# Patient Record
Sex: Male | Born: 1945
Health system: Southern US, Community
[De-identification: ages and names within clinical notes are randomized; demographics above are authoritative.]

## PROBLEM LIST (undated history)

## (undated) ENCOUNTER — Emergency Department (HOSPITAL_COMMUNITY): Discharge status: Against Medical Advice | Payer: BC Managed Care – PPO

## (undated) DIAGNOSIS — G629 Polyneuropathy, unspecified: Secondary | ICD-10-CM

## (undated) DIAGNOSIS — I10 Essential (primary) hypertension: Secondary | ICD-10-CM

## (undated) DIAGNOSIS — E119 Type 2 diabetes mellitus without complications: Secondary | ICD-10-CM

## (undated) DIAGNOSIS — K5090 Crohn's disease, unspecified, without complications: Secondary | ICD-10-CM

## (undated) DIAGNOSIS — H35349 Macular cyst, hole, or pseudohole, unspecified eye: Secondary | ICD-10-CM

## (undated) HISTORY — PX: CATARACT EXTRACTION: SUR2

## (undated) HISTORY — DX: Macular cyst, hole, or pseudohole, unspecified eye: H35.349

## (undated) HISTORY — DX: Polyneuropathy, unspecified: G62.9

## (undated) HISTORY — DX: Type 2 diabetes mellitus without complications: E11.9

## (undated) HISTORY — PX: CERVICAL SPINE SURGERY: SHX589

## (undated) HISTORY — DX: Crohn's disease, unspecified, without complications: K50.90

## (undated) HISTORY — DX: Essential (primary) hypertension: I10

---

## 2004-05-13 ENCOUNTER — Ambulatory Visit (HOSPITAL_COMMUNITY): Admission: RE | Admit: 2004-05-13 | Discharge: 2004-05-13 | Payer: Self-pay | Admitting: *Deleted

## 2004-05-13 ENCOUNTER — Emergency Department (HOSPITAL_COMMUNITY): Admission: EM | Admit: 2004-05-13 | Discharge: 2004-05-13 | Payer: Self-pay | Admitting: *Deleted

## 2004-05-13 ENCOUNTER — Ambulatory Visit: Payer: Self-pay | Admitting: *Deleted

## 2004-05-14 ENCOUNTER — Ambulatory Visit: Payer: Self-pay | Admitting: *Deleted

## 2004-05-15 ENCOUNTER — Ambulatory Visit: Payer: Self-pay | Admitting: Cardiology

## 2004-05-15 ENCOUNTER — Ambulatory Visit (HOSPITAL_COMMUNITY): Admission: RE | Admit: 2004-05-15 | Discharge: 2004-05-15 | Payer: Self-pay | Admitting: *Deleted

## 2005-06-26 ENCOUNTER — Ambulatory Visit (HOSPITAL_COMMUNITY): Admission: RE | Admit: 2005-06-26 | Discharge: 2005-06-26 | Payer: Self-pay | Admitting: Internal Medicine

## 2005-06-30 ENCOUNTER — Ambulatory Visit (HOSPITAL_COMMUNITY): Admission: RE | Admit: 2005-06-30 | Discharge: 2005-06-30 | Payer: Self-pay | Admitting: Internal Medicine

## 2008-10-16 ENCOUNTER — Ambulatory Visit (HOSPITAL_COMMUNITY): Admission: RE | Admit: 2008-10-16 | Discharge: 2008-10-17 | Payer: Self-pay | Admitting: Neurosurgery

## 2010-04-17 LAB — GLUCOSE, CAPILLARY
Glucose-Capillary: 151 mg/dL — ABNORMAL HIGH (ref 70–99)
Glucose-Capillary: 238 mg/dL — ABNORMAL HIGH (ref 70–99)

## 2010-04-17 LAB — CBC
HCT: 41.8 % (ref 39.0–52.0)
Hemoglobin: 14.2 g/dL (ref 13.0–17.0)
RBC: 4.64 MIL/uL (ref 4.22–5.81)
RDW: 14.9 % (ref 11.5–15.5)

## 2010-04-17 LAB — BASIC METABOLIC PANEL
Calcium: 8.7 mg/dL (ref 8.4–10.5)
Chloride: 104 mEq/L (ref 96–112)
GFR calc non Af Amer: >60 mL/min (ref >60)
Sodium: 137 mEq/L (ref 135–145)

## 2011-02-24 ENCOUNTER — Other Ambulatory Visit: Payer: Self-pay

## 2011-09-07 DIAGNOSIS — R079 Chest pain, unspecified: Secondary | ICD-10-CM

## 2011-09-25 ENCOUNTER — Ambulatory Visit: Payer: Self-pay | Admitting: Cardiology

## 2012-06-24 ENCOUNTER — Other Ambulatory Visit (HOSPITAL_COMMUNITY): Payer: Self-pay | Admitting: Internal Medicine

## 2012-06-24 ENCOUNTER — Ambulatory Visit (HOSPITAL_COMMUNITY)
Admission: RE | Admit: 2012-06-24 | Discharge: 2012-06-24 | Disposition: A | Discharge status: Home / Self Care | Payer: BC Managed Care – PPO | Source: Ambulatory Visit | Attending: Internal Medicine | Admitting: Internal Medicine

## 2012-06-24 DIAGNOSIS — R7611 Nonspecific reaction to tuberculin skin test without active tuberculosis: Secondary | ICD-10-CM | POA: Insufficient documentation

## 2013-06-19 ENCOUNTER — Encounter: Payer: Self-pay | Admitting: Neurology

## 2013-06-19 ENCOUNTER — Encounter (INDEPENDENT_AMBULATORY_CARE_PROVIDER_SITE_OTHER): Payer: Self-pay

## 2013-06-19 ENCOUNTER — Ambulatory Visit (INDEPENDENT_AMBULATORY_CARE_PROVIDER_SITE_OTHER): Payer: BC Managed Care – PPO | Admitting: Neurology

## 2013-06-19 VITALS — BP 159/88 | HR 84 | Temp 97.2°F | Ht 69.0 in | Wt 169.0 lb

## 2013-06-19 DIAGNOSIS — R569 Unspecified convulsions: Secondary | ICD-10-CM

## 2013-06-19 DIAGNOSIS — IMO0001 Reserved for inherently not codable concepts without codable children: Secondary | ICD-10-CM

## 2013-06-19 DIAGNOSIS — K5090 Crohn's disease, unspecified, without complications: Secondary | ICD-10-CM

## 2013-06-19 DIAGNOSIS — R42 Dizziness and giddiness: Secondary | ICD-10-CM

## 2013-06-19 DIAGNOSIS — G609 Hereditary and idiopathic neuropathy, unspecified: Secondary | ICD-10-CM

## 2013-06-19 NOTE — Progress Notes (Addendum)
Subjective:    Patient ID: Erik Holder is a 68 y.o. male.  HPI    Huston Foley, MD, PhD [facility] [address], 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 The patient is self-referred and accompanied by his wife today. 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 This has been happening infrequently for the last year, more so in the last 4-5 months and more progressive. He has had many short-lived episodes. Never actually lost consciousness, no falls, no chest pain, no shortness of breath, no recurrent HAs. Never had TIA or stroke symptoms, denying sudden onset of one sided weakness, numbness, tingling, slurring of speech or droopy face, hearing loss, tinnitus, diplopia or visual field cut or monocular loss of vision, and denies recurrent headaches. He describes a vague sense of lightheadedness, feeling weak globally at times and a sense of zoning out for spacing out. These episodes tend to happen more in the earlier part of the day. He has had some episodes at work that scared him. He is aware of his environment and surrounding but sometimes cannot get the words out. Overall these episodes or spells are brief, lasting less than 5 minutes at a time and he has full recollection of these and no anterograde or retrograde amnesia. He has a history of cervical spine disease and had cervical spine surgery some 3 years ago. His GI doctor is at [facility].  His Past  Medical History Is Significant For: Past Medical History  Diagnosis Date  . Crohn's disease   . DM (diabetes mellitus)     His Past Surgical History Is Significant For: No past surgical history on file.  His Family History Is Significant For: Family History  Problem Relation Age of Onset  . Cancer Sister     His Social History Is Significant For: History   Social History  . Marital Status: Married    Spouse Name: N/A    Number of Children: N/A  . Years of Education: N/A   Social History Main Topics  . Smoking status: Never Smoker   . Smokeless tobacco: Not on file  . Alcohol Use: No  . Drug Use: No  . Sexual Activity: Not on file   Other Topics Concern  . Not on file   Social History Narrative  . No narrative on file    His Allergies Are:  No Known Allergies:   His Current Medications Are:  Outpatient Encounter Prescriptions as of 06/19/2013  Medication Sig  . azathioprine (IMURAN) 100 MG tablet Take 100 mg by mouth daily.  Marland Kitchen glipiZIDE (GLUCOTROL) 10 MG tablet Take 10 mg by mouth daily before breakfast.  . inFLIXimab (REMICADE) 100 MG injection Inject into the vein.  Marland Kitchen losartan (COZAAR) 100 MG tablet Take 100 mg by mouth daily.  . metFORMIN (GLUCOPHAGE) 500 MG tablet Take 500 mg by mouth daily with breakfast.   Review of Systems:  Out of a complete 14 point review of systems, all are reviewed and negative with  the exception of these symptoms as listed below:  Review of Systems  HENT: Negative.   Eyes: Negative.   Respiratory: Negative.   Cardiovascular: Negative.   Gastrointestinal: Positive for blood in stool.  Endocrine: Negative.   Genitourinary: Negative.   Musculoskeletal: Positive for myalgias.       Muscle cramps  Skin: Negative.   Allergic/Immunologic: Negative.   Neurological: Positive for dizziness, syncope and weakness.  Hematological: Negative.   Psychiatric/Behavioral: Negative.     Objective:  Neurologic Exam  Physical  Exam  Assessment:   Physical Examination:   Filed Vitals:   06/19/13 1500  BP: 159/88  Pulse: 84  Temp:     General Examination: The patient is a very pleasant 68 y.o. male in no acute distress. He appears well-developed and well-nourished and very well groomed. He is mildly anxious appearing.  HEENT: Normocephalic, atraumatic, pupils are equal, round and reactive to light and accommodation. Funduscopic exam is normal with sharp disc margins noted. Extraocular tracking is good without limitation to gaze excursion or nystagmus noted. Normal smooth pursuit is noted. Hearing is grossly intact. Tympanic membranes are clear bilaterally. Face is symmetric with normal facial animation and normal facial sensation. Speech is clear with no dysarthria noted. There is no hypophonia. There is no lip, neck/head, jaw or voice tremor. Neck is supple with full range of passive and active motion. There are no carotid bruits on auscultation. Oropharynx exam reveals: mild mouth dryness, good dental hygiene and mild airway crowding, due to redundant soft palate. His uvula seems to be trimmed are absent. His tonsils are small or absent. He has a significant gag reflex and resistance to the tongue blade. Mallampati is class III. Tongue protrudes centrally and palate elevates symmetrically.   Chest: Clear to auscultation without wheezing, rhonchi or crackles noted.  Heart: S1+S2+0, regular and normal without murmurs, rubs or gallops noted.   Abdomen: Soft, non-tender and non-distended with normal bowel sounds appreciated on auscultation.  Extremities: There is no pitting edema in the distal lower extremities bilaterally. Pedal pulses are intact.  Skin: Warm and dry without trophic changes noted. There are no varicose veins.  Musculoskeletal: exam reveals no obvious joint deformities, tenderness or joint swelling or erythema.   Neurologically:  Mental status: The patient is awake, alert and oriented in all 4  spheres. His immediate and remote memory, attention, language skills and fund of knowledge are appropriate. There is no evidence of aphasia, agnosia, apraxia or anomia. Speech is clear with normal prosody and enunciation. Thought process is linear. Mood is normal and affect is normal.  Cranial nerves II - XII are as described above under HEENT exam. In addition: shoulder shrug is normal with equal shoulder height noted. Motor exam: Normal bulk, strength and tone is noted. There is no drift, tremor or rebound. Romberg is negative. Reflexes are 2+ throughout with the exception of a 3+ right knee jerk. Babinski: Toes are flexor bilaterally. Fine motor skills and coordination: intact with normal finger taps, normal hand movements, normal rapid alternating patting, normal foot taps and normal foot agility. He does not have any myoclonus. Cerebellar testing: No dysmetria or intention tremor on finger to nose testing. Heel to shin is unremarkable bilaterally. There is no truncal or gait ataxia.  Sensory exam: intact to light touch, pinprick, vibration, temperature sense proprioception in the upper extremities and decreased to all modalities in the distal lower extremities up to above the ankles bilaterally.  Gait, station and balance: He stands easily. No  veering to one side is noted. No leaning to one side is noted. Posture is age-appropriate and stance is narrow based. Gait shows normal stride length and normal pace. No problems turning are noted. He turns en bloc, but overall walks somewhat cautiously. Tandem walk is mildly difficult for him and so his heel stance. He has intact toe stance.               Assessment and Plan:    In summary, Jamse MeadBelayenh S Nissen is a very pleasant 68 y.o.-year old male with a history of Crohn's disease which was diagnosed about 16 years ago and he was placed on Remicade at the time. He has also been on Imuran for the same amount of time. He has been diagnosed with diabetes around  the same time and was initially treated with steroids for his Crohn's disease. He has been well controlled as far as his Crohn's is concerned as well as his diabetes is concerned. He presents with a approximately one year history but more so with a few month history of intermittent confusion, staring-like spell, lightheadedness, a sense of falling and staggering without obvious physical limitations. He does not appear toxic or sick and has reassuringly nonfocal exam with the exception of a brisk right patellar reflex and evidence of peripheral neuropathy, most likely diabetic in etiology given his history. His symptomatology is rather vague. I would like to proceed with further testing in the form of the EEG, nerve conduction testing for his neuropathy, blood work, and MRI brain with and without contrast due to his long-standing history of immune suppressant therapy. I reassured him that his exam is largely benign. We will call him as the test results are reported. I did not suggest any new medication or change in management at this time. He was in agreement. Of note, he does have a history of cervical spine disease status post cervical spine surgery some 3 years ago.  I answered all their questions today and the patient and his wife were in agreement with the above outlined plan. I would like to see the patient back in 3 months, sooner if the need arises and encouraged them to call with any interim questions, concerns, problems or updates and test results.

## 2013-06-19 NOTE — Patient Instructions (Addendum)
Lets do more testing with MRI brain with and without contrast as well as EEG, blood work and EMG/NCV testing. We will call you with the test results as they come.

## 2013-06-20 ENCOUNTER — Encounter: Payer: Self-pay | Admitting: *Deleted

## 2013-06-20 NOTE — Progress Notes (Signed)
Quick Note:  Please call Dr. Kristian CoveyBefekadu: Pls advise him to talk to his PCP or GI doctor about B12 supplementation via injections. His B12 is too low at 204 and vitamin D is low as well. He may want to talk to his PCP regarding vit D prescription strength and in the meantime he can start vitamin D orally, 2000 units daily. All other labs including inflammatory markers and TSH, as well as ammonia level were normal. Huston FoleySaima Williams Dietrick, MD, PhD [facility] ([facility]  ______

## 2013-06-20 NOTE — Progress Notes (Signed)
Quick Note:  I relayed the results to pt, recommendations for Vit D and B 12 injections per pcp or GI. I will release to mychart. He will call back if needed. ______

## 2013-06-22 LAB — IFE AND PE, SERUM
ALBUMIN SERPL ELPH-MCNC: 4.2 g/dL (ref 3.2–5.6)
Albumin/Glob SerPl: 1.4 (ref 0.7–2.0)
Alpha 1: 0.2 g/dL (ref 0.1–0.4)
Alpha2 Glob SerPl Elph-Mcnc: 0.6 g/dL (ref 0.4–1.2)
B-GLOBULIN SERPL ELPH-MCNC: 0.9 g/dL (ref 0.6–1.3)
GAMMA GLOB SERPL ELPH-MCNC: 1.5 g/dL (ref 0.5–1.6)
Globulin, Total: 3.1 g/dL (ref 2.0–4.5)
IGG (IMMUNOGLOBIN G), SERUM: 1680 mg/dL — AB (ref 700–1600)
IgA/Immunoglobulin A, Serum: 209 mg/dL (ref 91–414)
IgM (Immunoglobulin M), Srm: 161 mg/dL (ref 40–230)
TOTAL PROTEIN: 7.3 g/dL (ref 6.0–8.5)

## 2013-06-22 LAB — VITAMIN D 25 HYDROXY (VIT D DEFICIENCY, FRACTURES): Vit D, 25-Hydroxy: 15.8 ng/mL — ABNORMAL LOW (ref 30.0–100.0)

## 2013-06-22 LAB — B12 AND FOLATE PANEL
FOLATE: 14.5 ng/mL (ref >3.0)
Vitamin B-12: 204 pg/mL — ABNORMAL LOW (ref 211–946)

## 2013-06-22 LAB — TSH: TSH: 2.05 u[IU]/mL (ref 0.450–4.500)

## 2013-06-22 LAB — RPR: SYPHILIS RPR SCR: NONREACTIVE

## 2013-06-22 LAB — SEDIMENTATION RATE: SED RATE: 9 mm/h (ref 0–30)

## 2013-06-22 LAB — C-REACTIVE PROTEIN: CRP: 0.2 mg/L (ref 0.0–4.9)

## 2013-06-22 LAB — AMMONIA: Ammonia: 77 ug/dL (ref 27–102)

## 2013-06-22 LAB — ANA W/REFLEX: ANA: NEGATIVE

## 2013-06-28 ENCOUNTER — Ambulatory Visit
Admission: RE | Admit: 2013-06-28 | Discharge: 2013-06-28 | Disposition: A | Discharge status: Home / Self Care | Payer: BC Managed Care – PPO | Source: Ambulatory Visit | Attending: Neurology | Admitting: Neurology

## 2013-06-28 DIAGNOSIS — K5090 Crohn's disease, unspecified, without complications: Secondary | ICD-10-CM

## 2013-06-28 DIAGNOSIS — IMO0001 Reserved for inherently not codable concepts without codable children: Secondary | ICD-10-CM

## 2013-06-28 DIAGNOSIS — R42 Dizziness and giddiness: Secondary | ICD-10-CM

## 2013-06-28 DIAGNOSIS — G609 Hereditary and idiopathic neuropathy, unspecified: Secondary | ICD-10-CM

## 2013-06-28 DIAGNOSIS — R404 Transient alteration of awareness: Secondary | ICD-10-CM

## 2013-06-28 MED ORDER — GADOBENATE DIMEGLUMINE 529 MG/ML IV SOLN
15.0000 mL | Freq: Once | INTRAVENOUS | Status: AC | PRN
  Administered 2013-06-28: 15 mL via INTRAVENOUS

## 2013-06-29 NOTE — Progress Notes (Signed)
Quick Note:  Please call patient regarding the recent brain MRI: The brain scan showed a normal structure of the brain and no significant volume loss or atrophy. There were changes in the deeper structures of the brain, which we call white matter changes or microvascular changes. These were reported as mild in His case. These are tiny white spots, that occur with time and are seen in a variety of conditions, including with normal aging, chronic hypertension, chronic headaches, especially migraine HAs, chronic diabetes, chronic hyperlipidemia. These are not strokes and no mass or lesion or contrast enhancement was seen which is reassuring. Again, there were no acute findings, such as a stroke, or mass or blood products. No further action is required on this test at this time, other than re-enforcing the importance of good blood pressure control, good cholesterol control, good blood sugar control, and weight management. Please remind patient to keep any upcoming appointments or tests and to call us with any interim questions, concerns, problems or updates. Thanks,  Huston Foley, MD, PhD    ______

## 2013-07-04 ENCOUNTER — Other Ambulatory Visit: Payer: BC Managed Care – PPO

## 2013-07-04 NOTE — Progress Notes (Signed)
Quick Note:  I called pt and spoke to his wife and relayed the results. He has NCS/EMG and the noted afterwards that he also has EEG too, on 07-07-13. She verbalized understanding. ______

## 2013-07-07 ENCOUNTER — Ambulatory Visit (INDEPENDENT_AMBULATORY_CARE_PROVIDER_SITE_OTHER): Payer: BC Managed Care – PPO | Admitting: Radiology

## 2013-07-07 ENCOUNTER — Encounter (INDEPENDENT_AMBULATORY_CARE_PROVIDER_SITE_OTHER): Payer: Self-pay

## 2013-07-07 ENCOUNTER — Ambulatory Visit (INDEPENDENT_AMBULATORY_CARE_PROVIDER_SITE_OTHER): Payer: BC Managed Care – PPO | Admitting: Neurology

## 2013-07-07 DIAGNOSIS — R404 Transient alteration of awareness: Secondary | ICD-10-CM

## 2013-07-07 DIAGNOSIS — IMO0001 Reserved for inherently not codable concepts without codable children: Secondary | ICD-10-CM

## 2013-07-07 DIAGNOSIS — R42 Dizziness and giddiness: Secondary | ICD-10-CM

## 2013-07-07 DIAGNOSIS — K5090 Crohn's disease, unspecified, without complications: Secondary | ICD-10-CM

## 2013-07-07 DIAGNOSIS — R209 Unspecified disturbances of skin sensation: Secondary | ICD-10-CM

## 2013-07-07 DIAGNOSIS — Z0289 Encounter for other administrative examinations: Secondary | ICD-10-CM

## 2013-07-07 DIAGNOSIS — R202 Paresthesia of skin: Secondary | ICD-10-CM

## 2013-07-07 DIAGNOSIS — G609 Hereditary and idiopathic neuropathy, unspecified: Secondary | ICD-10-CM

## 2013-07-07 NOTE — Procedures (Signed)
   NCS (NERVE CONDUCTION STUDY) WITH EMG (ELECTROMYOGRAPHY) REPORT   STUDY DATE: June 26th 2015 PATIENT NAME: Erik Holder DOB: 1945/04/29 MRN: [id number]    TECHNOLOGIST: Gearldine Shown ELECTROMYOGRAPHER: Levert Feinstein M.D.  CLINICAL INFORMATION:  68 years old male with history of Crohn's disease, DM, presenting with few years history of bilateral hands and feet paresthesia, few episodes of sudden confusion. Hx of cervical decompression surgery for cervical radiculopathy  On exam: He has mild to moderate lower extremity spasticity.  There was no upper and lower extremity weakness. Intact vibration, pin prick at bilateral upper and lower extremities. He was hyperreflexia at both upper and lower extremities.  Plantar responses were flexor bilaterally.  FINDINGS: NERVE CONDUCTION STUDY: Bilateral peroneal sensory responses were normal.  Bilateral peroneal and tibial motor responses were normal.  Bilateral tibial-H reflexes were normal.  NEEDLE ELECTROMYOGRAPHY: Selected needle exam was performed at right lower extremity muscles and right lumbosacral paraspinals.   Needle examination of right tibialis anterior, tibialis posterior, peroneal longus, medial gastrocnemius, vastus lateralis was normal.  There was no spontaneous activities at right lumbosacral paraspinals at right L4, L5, S1.  IMPRESSION:   This is a normal study.  There was no electrodiagnostic evidence of right lumbar radiculopathy, or large fiber peripheral neuropathy.   INTERPRETING PHYSICIAN:   Levert Feinstein M.D. Ph.D. [facility] [address] [place], [place] [address] [phone]

## 2013-07-10 NOTE — Progress Notes (Signed)
Quick Note:  Please call and advise the patient that the recent EMG and nerve conduction velocity test, which is the electrical nerve and muscle test we we performed, was reported as within normal limits. We checked for abnormal electrical discharges in the muscles or nerves and the report suggested normal findings. No further action is required on this test at this time. Please remind patient to keep any upcoming appointments or tests and to call us with any interim questions, concerns, problems or updates. Thanks,  Saima Athar, MD, PhD   ______ 

## 2013-07-11 DIAGNOSIS — R42 Dizziness and giddiness: Secondary | ICD-10-CM | POA: Insufficient documentation

## 2013-07-11 DIAGNOSIS — IMO0001 Reserved for inherently not codable concepts without codable children: Secondary | ICD-10-CM | POA: Insufficient documentation

## 2013-07-11 NOTE — Procedures (Signed)
   HISTORY: 68 years old male, with episodes of intermittent confusion, there like spells, lightheadedness, since a falling, EEG for further evaluations  TECHNIQUE:  16 channel EEG was performed based on standard 10-16 international system. One channel was dedicated to EKG, which has demonstrates normal sinus rhythm of 78 beats per minutes.  Upon awakening, the posterior background activity was well-developed, in alpha range, 9 Hz, with amplitude of 40 microvoltage, reactive to eye opening and closure.  There was no evidence of epileptiform discharge.  Photic stimulation was performed, which induced a symmetric photic driving.  Hyperventilation was performed, there was no abnormality elicit.  Stage II sleep was achieved, as evident by vortex waves, sleep spindles, K complexes  CONCLUSION: This is a  normal awake and asleep EEG.  There is no electrodiagnostic evidence of epileptiform discharge

## 2013-07-12 ENCOUNTER — Encounter: Payer: Self-pay | Admitting: *Deleted

## 2013-07-12 NOTE — Progress Notes (Signed)
Quick Note:  Mailed copy of report to home address. He is to call if questions. Attempted to call home, no answer. Was not at work number. ______

## 2013-07-12 NOTE — Progress Notes (Signed)
Quick Note:  Please call and advise the patient that the EEG or brain wave test we performed was reported as normal in the awake and light sleep states. We checked for abnormal electrical discharges in the brain waves and the report suggested normal findings. No further action is required on this test at this time. Please remind patient to keep any upcoming appointments or tests and to call us with any interim questions, concerns, problems or updates. Thanks,  Huston Foley, MD, PhD    ______

## 2013-07-12 NOTE — Progress Notes (Signed)
Quick Note:  Shared normal NCV/EMG results with patient's wife and she verbalized understanding ______

## 2013-11-20 ENCOUNTER — Ambulatory Visit: Payer: BC Managed Care – PPO | Admitting: Neurology

## 2013-11-30 ENCOUNTER — Ambulatory Visit: Payer: BC Managed Care – PPO | Admitting: Neurology

## 2013-11-30 ENCOUNTER — Telehealth: Payer: Self-pay | Admitting: Neurology

## 2013-11-30 NOTE — Telephone Encounter (Signed)
Patient is a no show for today's appointment  (11/30/13)

## 2016-12-30 ENCOUNTER — Other Ambulatory Visit (HOSPITAL_COMMUNITY): Payer: Self-pay | Admitting: Internal Medicine

## 2016-12-30 ENCOUNTER — Ambulatory Visit (HOSPITAL_COMMUNITY)
Admission: RE | Admit: 2016-12-30 | Discharge: 2016-12-30 | Disposition: A | Discharge status: Home / Self Care | Payer: Medicare Other | Source: Ambulatory Visit | Attending: Internal Medicine | Admitting: Internal Medicine

## 2016-12-30 DIAGNOSIS — R05 Cough: Secondary | ICD-10-CM | POA: Diagnosis not present

## 2016-12-30 DIAGNOSIS — R059 Cough, unspecified: Secondary | ICD-10-CM

## 2017-11-08 ENCOUNTER — Other Ambulatory Visit: Payer: Self-pay | Admitting: Neurological Surgery

## 2017-11-08 DIAGNOSIS — M5416 Radiculopathy, lumbar region: Secondary | ICD-10-CM

## 2017-11-09 ENCOUNTER — Other Ambulatory Visit: Payer: Self-pay | Admitting: Internal Medicine

## 2017-11-09 DIAGNOSIS — M549 Dorsalgia, unspecified: Secondary | ICD-10-CM

## 2017-11-12 ENCOUNTER — Ambulatory Visit
Admission: RE | Admit: 2017-11-12 | Discharge: 2017-11-12 | Disposition: A | Discharge status: Home / Self Care | Payer: BLUE CROSS/BLUE SHIELD | Source: Ambulatory Visit | Attending: Internal Medicine | Admitting: Internal Medicine

## 2017-11-12 DIAGNOSIS — M549 Dorsalgia, unspecified: Secondary | ICD-10-CM

## 2017-11-16 ENCOUNTER — Ambulatory Visit
Admission: RE | Admit: 2017-11-16 | Discharge: 2017-11-16 | Disposition: A | Discharge status: Home / Self Care | Payer: Medicare Other | Source: Ambulatory Visit | Attending: Neurological Surgery | Admitting: Neurological Surgery

## 2017-11-16 DIAGNOSIS — M5416 Radiculopathy, lumbar region: Secondary | ICD-10-CM

## 2017-11-16 MED ORDER — IOPAMIDOL (ISOVUE-M 200) INJECTION 41%
1.0000 mL | Freq: Once | INTRAMUSCULAR | Status: AC
  Administered 2017-11-16: 1 mL via EPIDURAL

## 2017-11-16 MED ORDER — METHYLPREDNISOLONE ACETATE 40 MG/ML INJ SUSP (RADIOLOG
120.0000 mg | Freq: Once | INTRAMUSCULAR | Status: AC
  Administered 2017-11-16: 120 mg via EPIDURAL

## 2017-11-16 NOTE — Discharge Instructions (Signed)

## 2017-11-21 ENCOUNTER — Other Ambulatory Visit: Payer: BLUE CROSS/BLUE SHIELD

## 2018-03-16 DIAGNOSIS — M5126 Other intervertebral disc displacement, lumbar region: Secondary | ICD-10-CM | POA: Diagnosis present

## 2020-06-09 DIAGNOSIS — K5090 Crohn's disease, unspecified, without complications: Secondary | ICD-10-CM | POA: Diagnosis present

## 2020-06-18 DIAGNOSIS — I6522 Occlusion and stenosis of left carotid artery: Secondary | ICD-10-CM | POA: Diagnosis present

## 2020-07-01 ENCOUNTER — Emergency Department (HOSPITAL_COMMUNITY)
Admission: EM | Admit: 2020-07-01 | Discharge: 2020-07-01 | Discharge status: Against Medical Advice | Payer: Medicare Other

## 2020-07-01 NOTE — ED Notes (Signed)
Pt left AMA °

## 2020-07-02 ENCOUNTER — Encounter (HOSPITAL_COMMUNITY): Payer: Self-pay | Admitting: *Deleted

## 2020-07-02 ENCOUNTER — Other Ambulatory Visit: Payer: Self-pay

## 2020-07-02 ENCOUNTER — Emergency Department (HOSPITAL_COMMUNITY)
Admission: EM | Admit: 2020-07-02 | Discharge: 2020-07-02 | Disposition: A | Discharge status: Home / Self Care | Payer: Medicare Other | Attending: Emergency Medicine | Admitting: Emergency Medicine

## 2020-07-02 ENCOUNTER — Emergency Department (HOSPITAL_COMMUNITY): Payer: Medicare Other

## 2020-07-02 DIAGNOSIS — Z87891 Personal history of nicotine dependence: Secondary | ICD-10-CM | POA: Diagnosis not present

## 2020-07-02 DIAGNOSIS — E119 Type 2 diabetes mellitus without complications: Secondary | ICD-10-CM | POA: Insufficient documentation

## 2020-07-02 DIAGNOSIS — I10 Essential (primary) hypertension: Secondary | ICD-10-CM | POA: Diagnosis not present

## 2020-07-02 DIAGNOSIS — M5416 Radiculopathy, lumbar region: Secondary | ICD-10-CM | POA: Diagnosis not present

## 2020-07-02 DIAGNOSIS — Z79899 Other long term (current) drug therapy: Secondary | ICD-10-CM | POA: Diagnosis not present

## 2020-07-02 DIAGNOSIS — M545 Low back pain, unspecified: Secondary | ICD-10-CM | POA: Diagnosis present

## 2020-07-02 MED ORDER — DIAZEPAM 5 MG PO TABS
5.0000 mg | ORAL_TABLET | Freq: Once | ORAL | Status: AC
  Administered 2020-07-02: 5 mg via ORAL
  Filled 2020-07-02: qty 1

## 2020-07-02 MED ORDER — ACETAMINOPHEN 500 MG PO TABS
1000.0000 mg | ORAL_TABLET | Freq: Once | ORAL | Status: AC
  Administered 2020-07-02: 1000 mg via ORAL
  Filled 2020-07-02: qty 2

## 2020-07-02 MED ORDER — DIAZEPAM 5 MG PO TABS: 5.0000 mg | ORAL_TABLET | Freq: Three times a day (TID) | ORAL | 0 refills | Status: AC | PRN

## 2020-07-02 MED ORDER — KETOROLAC TROMETHAMINE 30 MG/ML IJ SOLN
30.0000 mg | Freq: Once | INTRAMUSCULAR | Status: AC
  Administered 2020-07-02: 30 mg via INTRAMUSCULAR
  Filled 2020-07-02: qty 1

## 2020-07-02 NOTE — ED Provider Notes (Signed)
[facility] EMERGENCY DEPARTMENT Provider Note   CSN: [id number] Arrival date & time: 07/02/20  1517     History Chief Complaint  Patient presents with   Back Pain    Erik Holder is a 75 y.o. male.  Patient presents with low back pain radiating down to right leg.  He has known history of L5-S1 disc herniation which has been present for approximately 4 years.  He reports that he follows with a spinal surgeon in [place].  He has not had any procedures on his low back but has been doing physical therapy and exercises for the last few years with great response.  Approximately 1 month ago he noticed worsening pain in his low back which has not improved with exercise.  Approximately 2 days ago he was trying a technique using pillows to help adjust his low back and since then has been having considerably worse pain.  He reports that for the most part his pain used to only be on the lateral aspect of his right lower extremity down to his ankle and may be a little in his low back but it is now predominantly in his low back.  He reports weakness which she is unsure if it is due to pain or true weakness.  The only medication he has taken is Tylenol with his last dose being at 7 AM this morning.  He acknowledges that in the past he had gotten steroid shots in his lumbar spine with little to no improvement.  MRI on 11/12/2017 showed L5-S1 right subarticular disc protrusion effacing the right lateral recess and displacing the right S1 nerve root.Patient was hospitalized on 5/29 at [facility] and had a CT lumbar spine completed showing similar L5-S1 right central to subarticular disc herniation.  There is question whether the right S1 nerve root was mildly displaced posteriorly.  There was also note of nonspecific small sclerotic lesions in the left iliac crest and in the L1 vertebral body may represent bony islands but small sclerotic metastatic lesions are not excluded.      Past Medical History:   Diagnosis Date   Crohn's disease (HCC)    DM (diabetes mellitus) (HCC)    HTN (hypertension)    Macular cyst, hole, or pseudohole of retina    Neuropathy     Patient Active Problem List   Diagnosis Date Noted   Staring spell 07/11/2013   Dizziness and giddiness 07/11/2013   Paresthesia 07/07/2013    Past Surgical History:  Procedure Laterality Date   CATARACT EXTRACTION     CERVICAL SPINE SURGERY         Family History  Problem Relation Age of Onset   Cancer Sister     Social History   Tobacco Use   Smoking status: Former    Pack years: 0.00    Types: Cigarettes    Quit date: 08/20/1991    Years since quitting: 28.8  Substance Use Topics   Alcohol use: No   Drug use: No    Home Medications Prior to Admission medications   Medication Sig Start Date End Date Taking? Authorizing Provider  amLODipine (NORVASC) 5 MG tablet Take 1 tablet by mouth daily. 04/29/20  Yes [provider]  atorvastatin (LIPITOR) 40 MG tablet Take 1 tablet by mouth daily. 06/17/20  Yes [provider]  azathioprine (IMURAN) 100 MG tablet Take 100 mg by mouth daily.   Yes [provider]  diazepam (VALIUM) 5 MG tablet Take 1 tablet (5 mg  total) by mouth every 8 (eight) hours as needed for up to 3 days for anxiety. 07/02/20 07/05/20 Yes Derrel Nip, MD  losartan (COZAAR) 100 MG tablet Take 100 mg by mouth daily.   Yes [provider]  Mesalamine 800 MG TBEC Take 2 tablets by mouth 2 (two) times daily. 05/03/20  Yes [provider]  TRADJENTA 5 MG TABS tablet Take 5 mg by mouth daily. 06/18/20  Yes [provider]  baclofen (LIORESAL) 10 MG tablet Take 10 mg by mouth 2 (two) times daily. Patient not taking: Reported on 07/02/2020 06/12/20   [provider]    Allergies    Metformin  Review of Systems   Review of Systems  Constitutional:  Negative for chills and fever.  HENT:  Negative for congestion and rhinorrhea.   Respiratory:   Negative for shortness of breath.   Cardiovascular:  Negative for chest pain.  Gastrointestinal:  Negative for abdominal pain, blood in stool, diarrhea, nausea and vomiting.  Genitourinary:  Negative for hematuria.  Musculoskeletal:  Positive for back pain.  Neurological:  Positive for weakness (In the right lower extremity, may be limited by pain). Negative for headaches.   Physical Exam Updated Vital Signs BP (!) 166/81 (BP Location: Right Arm)   Pulse 71   Temp 97.7 F (36.5 C) (Oral)   Resp 18   Ht 5\' 9"  (1.753 m)   Wt 64.9 kg   SpO2 99%   BMI 21.12 kg/m   Physical Exam Constitutional:      Appearance: Normal appearance. He is normal weight.  HENT:     Head: Normocephalic and atraumatic.  Eyes:     Extraocular Movements: Extraocular movements intact.  Cardiovascular:     Rate and Rhythm: Normal rate and regular rhythm.     Heart sounds: No murmur heard. Pulmonary:     Effort: Pulmonary effort is normal. No respiratory distress.     Breath sounds: Normal breath sounds.  Abdominal:     General: Abdomen is flat. There is no distension.     Palpations: Abdomen is soft.  Musculoskeletal:        General: Tenderness (Tenderness to palpation in the paraspinal area right lumbar region.) present.     Cervical back: Normal range of motion.     Right lower leg: No edema.  Neurological:     Mental Status: He is alert.     Sensory: No sensory deficit.     Motor: Weakness (Diffuse weakness in the right lower extremity.  Patient is unsure if it is limited by pain versus true weakness) present.  Psychiatric:        Mood and Affect: Mood normal.    ED Results / Procedures / Treatments   Labs (all labs ordered are listed, but only abnormal results are displayed) Labs Reviewed - No data to display  EKG None  Radiology MR LUMBAR SPINE WO CONTRAST  Result Date: 07/02/2020 CLINICAL DATA:  Lumbar radiculopathy.  Right leg pain EXAM: MRI LUMBAR SPINE WITHOUT CONTRAST TECHNIQUE:  Multiplanar, multisequence MR imaging of the lumbar spine was performed. No intravenous contrast was administered. COMPARISON:  Lumbar MRI 11/12/2017 FINDINGS: Segmentation:  Normal Alignment:  Normal Vertebrae:  Normal bone marrow.  Negative for fracture or mass. Conus medullaris and cauda equina: Conus extends to the L2 level. Conus and cauda equina appear normal. Paraspinal and other soft tissues: Negative for paraspinous mass or adenopathy. Disc levels: L1-2: Negative L2-3: Mild disc bulging and mild facet degeneration. Negative for  stenosis L3-4: Mild disc and mild facet degeneration.  Negative for stenosis. L4-5: Mild disc bulging and mild facet degeneration. Mild subarticular stenosis bilaterally. L5-S1: Right-sided disc protrusion with compression of the right S1 nerve root, similar to the prior MRI. Disc bulging and mild left subarticular stenosis. IMPRESSION: Right-sided disc protrusion L5-S1 with right S1 nerve root impingement, similar to the prior MRI 2019. Mild degenerative change throughout the remainder of the lumbar spine. Electronically Signed   By: Marlan Palau M.D.   On: 07/02/2020 17:31    Procedures Procedures   Medications Ordered in ED Medications  ketorolac (TORADOL) 30 MG/ML injection 30 mg (30 mg Intramuscular Given 07/02/20 1644)  acetaminophen (TYLENOL) tablet 1,000 mg (1,000 mg Oral Given 07/02/20 1643)  diazepam (VALIUM) tablet 5 mg (5 mg Oral Given 07/02/20 1643)    ED Course  I have reviewed the triage vital signs and the nursing notes.  Pertinent labs & imaging results that were available during my care of the patient were reviewed by me and considered in my medical decision making (see chart for details).    MDM Rules/Calculators/A&P                          75 year old male with known L5-S1 lumbar disc herniation presenting with worsening low back pain and weakness in the right lower extremity.  On arrival vital signs are within normal limit.  Patient reports  he is unable to bear weight on the right lower extremity due to pain/weakness.  Most recent imaging was approximately 1 month ago.  It showed stable disc herniation but the progression has happened over the last 2 days.  Patient given one-time dose of Toradol as well as 1 g of Tylenol.  MRI of the lumbar spine ordered.  Patient was also given Valium to assist with the pain during MRI.  MRI findings consistent with similar findings from the MRI in 2019.  Patient has close follow-up with neurosurgeon so patient was discharged with 10 doses of Valium and he will follow-up with his neurosurgeon on Thursday 6/23.  Strict ED and return precautions given. Final Clinical Impression(s) / ED Diagnoses Final diagnoses:  Lumbar radiculopathy    Rx / DC Orders ED Discharge Orders          Ordered    diazepam (VALIUM) 5 MG tablet  Every 8 hours PRN        07/02/20 1844             Derrel Nip, MD 07/02/20 1844    Blane Ohara, MD 07/03/20 0000

## 2020-07-02 NOTE — ED Notes (Signed)
Pt in hallway 4, pt states that Sunday he did an exercise with a pillow and since then he has had decreased sensation and strength in his R leg.

## 2020-07-02 NOTE — Discharge Instructions (Addendum)
It was great meeting you today.  I am sorry you are having these problems with your radiculopathy.  We completed an MRI which was essentially stable from your previous MRI in 2019.  I sent a prescription for Valium to your pharmacy to tide you over until you see your neurosurgeon on Thursday.  I hope you get to feeling better!

## 2020-07-02 NOTE — ED Notes (Signed)
Pt moved to er room 8, pt reports 7/10 back pain

## 2020-07-02 NOTE — ED Notes (Signed)
Pt back from mri, pt reports a slight decrease in pain

## 2020-07-02 NOTE — ED Triage Notes (Signed)
Pt with lower back pain worse since yesterday morning after trying an exercise for his back.  Pt with hx of herniated disc to back.  Pt with tingling to right leg.

## 2020-07-06 ENCOUNTER — Emergency Department (HOSPITAL_COMMUNITY)
Admission: EM | Admit: 2020-07-06 | Discharge: 2020-07-06 | Disposition: A | Discharge status: Home / Self Care | Payer: Medicare Other | Attending: Emergency Medicine | Admitting: Emergency Medicine

## 2020-07-06 ENCOUNTER — Emergency Department (HOSPITAL_COMMUNITY): Payer: Medicare Other

## 2020-07-06 ENCOUNTER — Encounter (HOSPITAL_COMMUNITY): Payer: Self-pay | Admitting: *Deleted

## 2020-07-06 DIAGNOSIS — K6289 Other specified diseases of anus and rectum: Secondary | ICD-10-CM

## 2020-07-06 DIAGNOSIS — Z79899 Other long term (current) drug therapy: Secondary | ICD-10-CM | POA: Diagnosis not present

## 2020-07-06 DIAGNOSIS — Z7984 Long term (current) use of oral hypoglycemic drugs: Secondary | ICD-10-CM | POA: Diagnosis not present

## 2020-07-06 DIAGNOSIS — I10 Essential (primary) hypertension: Secondary | ICD-10-CM | POA: Insufficient documentation

## 2020-07-06 DIAGNOSIS — Z87891 Personal history of nicotine dependence: Secondary | ICD-10-CM | POA: Diagnosis not present

## 2020-07-06 DIAGNOSIS — E1140 Type 2 diabetes mellitus with diabetic neuropathy, unspecified: Secondary | ICD-10-CM | POA: Insufficient documentation

## 2020-07-06 DIAGNOSIS — K5900 Constipation, unspecified: Secondary | ICD-10-CM | POA: Diagnosis not present

## 2020-07-06 DIAGNOSIS — R109 Unspecified abdominal pain: Secondary | ICD-10-CM | POA: Diagnosis present

## 2020-07-06 LAB — CBC WITH DIFFERENTIAL/PLATELET
Abs Immature Granulocytes: 0.05 10*3/uL (ref 0.00–0.07)
Basophils Absolute: 0 10*3/uL (ref 0.0–0.1)
Basophils Relative: 0 %
Eosinophils Absolute: 0 10*3/uL (ref 0.0–0.5)
Eosinophils Relative: 0 %
HCT: 38.8 % — ABNORMAL LOW (ref 39.0–52.0)
Hemoglobin: 13.1 g/dL (ref 13.0–17.0)
Immature Granulocytes: 0 %
Lymphocytes Relative: 39 %
Lymphs Abs: 4.7 10*3/uL — ABNORMAL HIGH (ref 0.7–4.0)
MCH: 32.6 pg (ref 26.0–34.0)
MCHC: 33.8 g/dL (ref 30.0–36.0)
MCV: 96.5 fL (ref 80.0–100.0)
Monocytes Absolute: 1.1 10*3/uL — ABNORMAL HIGH (ref 0.1–1.0)
Monocytes Relative: 9 %
Neutro Abs: 6.2 10*3/uL (ref 1.7–7.7)
Neutrophils Relative %: 52 %
Platelets: 239 10*3/uL (ref 150–400)
RBC: 4.02 MIL/uL — ABNORMAL LOW (ref 4.22–5.81)
RDW: 14.7 % (ref 11.5–15.5)
WBC: 12 10*3/uL — ABNORMAL HIGH (ref 4.0–10.5)
nRBC: 0 % (ref 0.0–0.2)

## 2020-07-06 LAB — COMPREHENSIVE METABOLIC PANEL
ALT: 29 U/L (ref 0–44)
AST: 26 U/L (ref 15–41)
Albumin: 3.9 g/dL (ref 3.5–5.0)
Alkaline Phosphatase: 80 U/L (ref 38–126)
Anion gap: 9 (ref 5–15)
BUN: 18 mg/dL (ref 8–23)
CO2: 26 mmol/L (ref 22–32)
Calcium: 9.1 mg/dL (ref 8.9–10.3)
Chloride: 100 mmol/L (ref 98–111)
Creatinine, Ser: 1.06 mg/dL (ref 0.61–1.24)
GFR, Estimated: >60 mL/min (ref >60)
Glucose, Bld: 196 mg/dL — ABNORMAL HIGH (ref 70–99)
Potassium: 3.3 mmol/L — ABNORMAL LOW (ref 3.5–5.1)
Sodium: 135 mmol/L (ref 135–145)
Total Bilirubin: 0.8 mg/dL (ref 0.3–1.2)
Total Protein: 7.1 g/dL (ref 6.5–8.1)

## 2020-07-06 LAB — SEDIMENTATION RATE: Sed Rate: 5 mm/hr (ref 0–16)

## 2020-07-06 LAB — LIPASE, BLOOD: Lipase: 99 U/L — ABNORMAL HIGH (ref 11–51)

## 2020-07-06 LAB — C-REACTIVE PROTEIN: CRP: 0.7 mg/dL (ref <1.0)

## 2020-07-06 MED ORDER — FENTANYL CITRATE (PF) 100 MCG/2ML IJ SOLN
50.0000 ug | Freq: Once | INTRAMUSCULAR | Status: AC
  Administered 2020-07-06: 50 ug via INTRAVENOUS
  Filled 2020-07-06: qty 2

## 2020-07-06 MED ORDER — IOHEXOL 9 MG/ML PO SOLN
ORAL | Status: AC
  Filled 2020-07-06: qty 1000

## 2020-07-06 MED ORDER — FLEET ENEMA 7-19 GM/118ML RE ENEM
1.0000 | ENEMA | Freq: Once | RECTAL | Status: AC
  Administered 2020-07-06: 1 via RECTAL

## 2020-07-06 MED ORDER — IOHEXOL 300 MG/ML  SOLN
75.0000 mL | Freq: Once | INTRAMUSCULAR | Status: AC | PRN
  Administered 2020-07-06: 75 mL via INTRAVENOUS

## 2020-07-06 NOTE — ED Provider Notes (Signed)
  Physical Exam  BP (!) 144/84 (BP Location: Right Arm)   Pulse 78   Temp 98.4 F (36.9 C) (Oral)   Resp 12   SpO2 100%   Physical Exam  ED Course/Procedures   Clinical Course as of 07/06/20 2057  Sat Jul 06, 2020  1425 Patient did not tolerate enema well, only able to do about half and could not hold it in his rectum. He is on bedside commode trying un-successfully to pass stool. Will check labs and CT to ensure no signs of crohn's flare.  [CS]  1451 CBC with mild leukocytosis [CS]  1506 CMP is unremarkable. Lipase mildly elevated of unclear significance.  [CS]  1516 Care of the patient signed out to Dr. Rhunette Croft at the change of shift.  [CS]    Clinical Course User Index [CS] Pollyann Savoy, MD    Procedures  MDM   Spoke with patient's GI doctors at [facility] after the CT scan.  The CT scan revealed inflammation around the rectum.  Patient also has severe constipation.  Patient thinks that his symptoms are because of constipation and unlikely to be a Crohn's flare.  I spoke with the GI team and they recommended getting inflammatory markers.  Inflammatory markers were actually negative.  Given that, they do not think he needs to be on steroids and have requested aggressive bowel regimen with stool softeners and laxatives.  For now they want him to take MiraLAX and add senna if needed.  This plan has been communicated to the patient.      Derwood Kaplan, MD 07/06/20 [phone]

## 2020-07-06 NOTE — ED Notes (Signed)
This nurse in at bedside during EDP's rectal exam.

## 2020-07-06 NOTE — ED Provider Notes (Signed)
[facility] EMERGENCY DEPARTMENT Provider Note  CSN: [id number] Arrival date & time: 07/06/20 1330    History Chief Complaint  Patient presents with   Abdominal Pain    Erik Holder is a 75 y.o. male with history of crohn's disease has recently been having issues with lumbar radiculopathy, followed by Neurosurgery and scheduled for surgery in 4 days. He was also seen in the ED for back pain 4 days ago and had MRI showing a disc protrusion but no cauda equina syndrome. He reports he has been unable to have a BM since then. He states he feels rectal pressure and is concerned he may be impacted. He has tried miralax and suppositories without relief. Some abdominal discomfort but most of his pain is lumbar.    Past Medical History:  Diagnosis Date   Crohn's disease (HCC)    DM (diabetes mellitus) (HCC)    HTN (hypertension)    Macular cyst, hole, or pseudohole of retina    Neuropathy     Past Surgical History:  Procedure Laterality Date   CATARACT EXTRACTION     CERVICAL SPINE SURGERY      Family History  Problem Relation Age of Onset   Cancer Sister     Social History   Tobacco Use   Smoking status: Former    Pack years: 0.00    Types: Cigarettes    Quit date: 08/20/1991    Years since quitting: 28.8   Smokeless tobacco: Never  Substance Use Topics   Alcohol use: No   Drug use: No     Home Medications Prior to Admission medications   Medication Sig Start Date End Date Taking? Authorizing Provider  amLODipine (NORVASC) 5 MG tablet Take 1 tablet by mouth daily. 04/29/20   [provider]  atorvastatin (LIPITOR) 40 MG tablet Take 1 tablet by mouth daily. 06/17/20   [provider]  azathioprine (IMURAN) 100 MG tablet Take 100 mg by mouth daily.    [provider]  baclofen (LIORESAL) 10 MG tablet Take 10 mg by mouth 2 (two) times daily. Patient not taking: Reported on 07/02/2020 06/12/20   [provider]  losartan (COZAAR) 100  MG tablet Take 100 mg by mouth daily.    [provider]  Mesalamine 800 MG TBEC Take 2 tablets by mouth 2 (two) times daily. 05/03/20   [provider]  TRADJENTA 5 MG TABS tablet Take 5 mg by mouth daily. 06/18/20   [provider]     Allergies    Metformin   Review of Systems   Review of Systems A comprehensive review of systems was completed and negative except as noted in HPI.    Physical Exam BP (!) 153/72   Pulse (!) 102   Resp 20   SpO2 99%   Physical Exam Vitals and nursing note reviewed.  Constitutional:      Appearance: Normal appearance.  HENT:     Head: Normocephalic and atraumatic.     Nose: Nose normal.     Mouth/Throat:     Mouth: Mucous membranes are moist.  Eyes:     Extraocular Movements: Extraocular movements intact.     Conjunctiva/sclera: Conjunctivae normal.  Cardiovascular:     Rate and Rhythm: Normal rate.  Pulmonary:     Effort: Pulmonary effort is normal.     Breath sounds: Normal breath sounds.  Abdominal:     General: Abdomen is flat.     Palpations: Abdomen is soft.  Tenderness: There is no abdominal tenderness. There is no guarding. Negative signs include Murphy's sign and McBurney's sign.  Genitourinary:    Comments: Rectal: chaperone present. Externally normal, mild tenderness on digital rectal exam, there is some stool in rectum but not particularly large or hard.  Musculoskeletal:        General: No swelling. Normal range of motion.     Cervical back: Neck supple.  Skin:    General: Skin is warm and dry.  Neurological:     General: No focal deficit present.     Mental Status: He is alert.  Psychiatric:        Mood and Affect: Mood normal.     ED Results / Procedures / Treatments   Labs (all labs ordered are listed, but only abnormal results are displayed) Labs Reviewed - No data to display  EKG None  Radiology No results found.  Procedures Procedures  Medications Ordered in the  ED Medications - No data to display   MDM Rules/Calculators/A&P MDM Patient states his prior crohns issues have been lower in the colon. It is possible his rectal pain is crohn's related as opposed to constipation. He would like to try a fleet's enema first and if no relief may need to move to labs and CT imaging. Patient is amenable to this plan.   ED Course  I have reviewed the triage vital signs and the nursing notes.  Pertinent labs & imaging results that were available during my care of the patient were reviewed by me and considered in my medical decision making (see chart for details).  Clinical Course as of 07/07/20 2585  Sat Jul 06, 2020  1425 Patient did not tolerate enema well, only able to do about half and could not hold it in his rectum. He is on bedside commode trying un-successfully to pass stool. Will check labs and CT to ensure no signs of crohn's flare.  [CS]  1451 CBC with mild leukocytosis [CS]  1506 CMP is unremarkable. Lipase mildly elevated of unclear significance.  [CS]  1516 Care of the patient signed out to Dr. Rhunette Croft at the change of shift.  [CS]    Clinical Course User Index [CS] Pollyann Savoy, MD    Final Clinical Impression(s) / ED Diagnoses Final diagnoses:  None    Rx / DC Orders ED Discharge Orders     None        Pollyann Savoy, MD 07/07/20 0700

## 2020-07-06 NOTE — ED Notes (Signed)
ED Provider at bedside. 

## 2020-07-06 NOTE — ED Notes (Signed)
Pt states that he had a small BM and feels better.

## 2020-07-06 NOTE — ED Triage Notes (Signed)
Abdominal pain with constipation x 4 days 

## 2020-07-06 NOTE — Discharge Instructions (Addendum)
We saw in the ER for pain.  CT scan confirms inflammation around the rectum and severe constipation.  Your case was discussed with your GI team.  Because your inflammatory markers are normal, they do not want to go on steroids.  They want you to focus on appropriate constipation management, by taking MiraLAX, you can add senna if not responding in 2 days.  Return to the ER if you start having worsening pain, fevers, chills, bloody stools.

## 2021-05-08 ENCOUNTER — Encounter: Payer: Self-pay | Admitting: "Endocrinology

## 2021-05-08 ENCOUNTER — Ambulatory Visit (INDEPENDENT_AMBULATORY_CARE_PROVIDER_SITE_OTHER): Payer: Medicare Other | Admitting: "Endocrinology

## 2021-05-08 VITALS — BP 136/68 | HR 84 | Wt 148.8 lb

## 2021-05-08 DIAGNOSIS — I10 Essential (primary) hypertension: Secondary | ICD-10-CM | POA: Insufficient documentation

## 2021-05-08 DIAGNOSIS — E559 Vitamin D deficiency, unspecified: Secondary | ICD-10-CM | POA: Diagnosis not present

## 2021-05-08 DIAGNOSIS — E782 Mixed hyperlipidemia: Secondary | ICD-10-CM

## 2021-05-08 DIAGNOSIS — E119 Type 2 diabetes mellitus without complications: Secondary | ICD-10-CM | POA: Diagnosis not present

## 2021-05-08 MED ORDER — FREESTYLE LIBRE 2 SENSOR MISC: 1.0000 | 3 refills | Status: AC

## 2021-05-08 MED ORDER — FREESTYLE LIBRE 2 READER DEVI: 0 refills | Status: AC

## 2021-05-08 NOTE — Patient Instructions (Signed)

## 2021-05-08 NOTE — Progress Notes (Signed)
? ?                                                             Endocrinology Consult Note  ?     05/08/2021, 5:49 PM ? ? ?Subjective:  ? ? Patient ID: Erik Holder, male    DOB: 14-Nov-1945.  ?Erik Holder is being seen in consultation for management of currently controlled asymptomatic diabetes requested by  Erik Spar, MD. ? ? ?Past Medical History:  ?Diagnosis Date  ? Crohn's disease (HCC)   ? DM (diabetes mellitus) (HCC)   ? HTN (hypertension)   ? Macular cyst, hole, or pseudohole of retina   ? Neuropathy   ? ? ?Past Surgical History:  ?Procedure Laterality Date  ? CATARACT EXTRACTION    ? CERVICAL SPINE SURGERY    ? ? ?Social History  ? ?Socioeconomic History  ? Marital status: Married  ?  Spouse name: Not on file  ? Number of children: Not on file  ? Years of education: Not on file  ? Highest education level: Not on file  ?Occupational History  ? Not on file  ?Tobacco Use  ? Smoking status: Former  ?  Types: Cigarettes  ?  Quit date: 08/20/1991  ?  Years since quitting: 29.7  ? Smokeless tobacco: Never  ?Substance and Sexual Activity  ? Alcohol use: No  ? Drug use: No  ? Sexual activity: Not on file  ?Other Topics Concern  ? Not on file  ?Social History Narrative  ? Not on file  ? ?Social Determinants of Health  ? ?Financial Resource Strain: Not on file  ?Food Insecurity: Not on file  ?Transportation Needs: Not on file  ?Physical Activity: Not on file  ?Stress: Not on file  ?Social Connections: Not on file  ? ? ?Family History  ?Problem Relation Age of Onset  ? Cancer Sister   ? ? ?Outpatient Encounter Medications as of 05/08/2021  ?Medication Sig  ? Continuous Blood Gluc Receiver (FREESTYLE LIBRE 2 READER) DEVI As directed  ? Continuous Blood Gluc Sensor (FREESTYLE LIBRE 2 SENSOR) MISC 1 Piece by Does not apply route every 14 (fourteen) days.  ? atorvastatin (LIPITOR) 40 MG tablet Take 1 tablet by mouth every other day.  ? losartan (COZAAR) 100 MG tablet Take 100 mg  by mouth daily.  ? Mesalamine 800 MG TBEC Take 2 tablets by mouth 2 (two) times daily.  ? [DISCONTINUED] amLODipine (NORVASC) 5 MG tablet Take 1 tablet by mouth daily.  ? [DISCONTINUED] azathioprine (IMURAN) 100 MG tablet Take 100 mg by mouth daily.  ? [DISCONTINUED] baclofen (LIORESAL) 10 MG tablet Take 10 mg by mouth 2 (two) times daily. (Patient not taking: Reported on 07/02/2020)  ? [DISCONTINUED] TRADJENTA 5 MG TABS tablet Take 5 mg by mouth daily. (Patient not taking: Reported on 05/08/2021)  ? ?No facility-administered encounter medications on file as of 05/08/2021.  ? ? ?ALLERGIES: ?Allergies  ?Allergen Reactions  ? Metformin Diarrhea  ? ? ?VACCINATION STATUS: ? ?There is no immunization history on file for this patient. ? ?Diabetes ?He presents for his initial diabetic visit. He has type 2 diabetes mellitus. Onset time: He was diagnosed at approximate age of 52 years. His disease course has been improving. There are no hypoglycemic associated symptoms.  Pertinent negatives for hypoglycemia include no confusion, headaches, pallor or seizures. There are no diabetic associated symptoms. Pertinent negatives for diabetes include no chest pain, no fatigue, no polydipsia, no polyphagia, no polyuria and no weakness. There are no hypoglycemic complications. Symptoms are stable. There are no diabetic complications. Risk factors for coronary artery disease include dyslipidemia, diabetes mellitus, hypertension and male sex. Current diabetic treatment includes oral agent (monotherapy) (He was on Tradjenta 5 mg p.o. daily until 10 days ago when he stopped it due to tightening glycemic profile.). His weight is stable. He is following a generally healthy diet. Meal planning includes avoidance of concentrated sweets. He has not had a previous visit with a dietitian. He participates in exercise daily. His home blood glucose trend is decreasing steadily. His breakfast blood glucose range is generally 130-140 mg/dl. His lunch  blood glucose range is generally 130-140 mg/dl. His dinner blood glucose range is generally 130-140 mg/dl. His bedtime blood glucose range is generally 130-140 mg/dl. His overall blood glucose range is 130-140 mg/dl. (He wears a CGM device-Freestyle libre.  His AGP analysis shows average blood glucose of 130 for the last 14 days.  He has 92% time range, 8% slightly above range.  He has no hypoglycemia.  His recent A1c was 6+ percent.) An ACE inhibitor/angiotensin II receptor blocker is being taken.  ?Hyperlipidemia ?This is a chronic problem. Exacerbating diseases include diabetes. Pertinent negatives include no chest pain, myalgias or shortness of breath. Current antihyperlipidemic treatment includes statins. Risk factors for coronary artery disease include dyslipidemia, male sex and hypertension.  ?Hypertension ?This is a chronic problem. The problem is controlled. Pertinent negatives include no chest pain, headaches, neck pain, palpitations or shortness of breath. Risk factors for coronary artery disease include dyslipidemia and diabetes mellitus. Past treatments include lifestyle changes, angiotensin blockers and calcium channel blockers.  ? ? ?Review of Systems  ?Constitutional:  Negative for chills, fatigue, fever and unexpected weight change.  ?HENT:  Negative for dental problem, mouth sores and trouble swallowing.   ?Eyes:  Negative for visual disturbance.  ?Respiratory:  Negative for cough, choking, chest tightness, shortness of breath and wheezing.   ?Cardiovascular:  Negative for chest pain, palpitations and leg swelling.  ?Gastrointestinal:  Negative for abdominal distention, abdominal pain, constipation, diarrhea, nausea and vomiting.  ?Endocrine: Negative for polydipsia, polyphagia and polyuria.  ?Genitourinary:  Negative for dysuria, flank pain, hematuria and urgency.  ?Musculoskeletal:  Negative for back pain, gait problem, myalgias and neck pain.  ?Skin:  Negative for pallor, rash and wound.   ?Neurological:  Negative for seizures, syncope, weakness, numbness and headaches.  ?Psychiatric/Behavioral:  Negative for confusion and dysphoric mood.   ? ?Objective:  ?  ? ?  05/08/2021  ?  2:30 PM 07/06/2020  ?  8:41 PM 07/06/2020  ?  5:22 PM  ?Vitals with BMI  ?Weight 148 lbs 13 oz    ?Systolic 136 144 749  ?Diastolic 68 84 78  ?Pulse 84 78 80  ? ? ?BP 136/68   Pulse 84   Wt 148 lb 12.8 oz (67.5 kg)   BMI 21.97 kg/m?   ?Wt Readings from Last 3 Encounters:  ?05/08/21 148 lb 12.8 oz (67.5 kg)  ?07/02/20 143 lb (64.9 kg)  ?06/19/13 169 lb (76.7 kg)  ?  ? ? ?Limited physical exam ?Physical Exam ?Constitutional:   ?   General: He is not in acute distress. ?   Appearance: He is well-developed.  ?HENT:  ?   Head: Normocephalic and  atraumatic.  ?Neck:  ?   Thyroid: No thyromegaly.  ?Cardiovascular:  ?   Rate and Rhythm: Normal rate.  ?Pulmonary:  ?   Effort: Pulmonary effort is normal. No respiratory distress.  ?   Breath sounds: No wheezing.  ?Abdominal:  ?   Tenderness: There is no abdominal tenderness.  ?Musculoskeletal:  ?   Cervical back: Normal range of motion and neck supple.  ?Skin: ?   General: Skin is warm.  ?   Findings: No rash.  ?   Nails: There is no clubbing.  ?Neurological:  ?   Mental Status: He is alert and oriented to person, place, and time.  ?   Cranial Nerves: No cranial nerve deficit.  ?   Sensory: No sensory deficit.  ?   Gait: Gait normal.  ?   Deep Tendon Reflexes: Reflexes are normal and symmetric.  ?Psychiatric:     ?   Speech: Speech normal.     ?   Behavior: Behavior normal. Behavior is cooperative.     ?   Thought Content: Thought content normal.     ?   Judgment: Judgment normal.  ? ? ? ? ?CMP ( most recent) ?CMP  ?   ?Component Value Date/Time  ? NA 135 07/06/2020 1425  ? K 3.3 (L) 07/06/2020 1425  ? CL 100 07/06/2020 1425  ? CO2 26 07/06/2020 1425  ? GLUCOSE 196 (H) 07/06/2020 1425  ? BUN 18 07/06/2020 1425  ? CREATININE 1.06 07/06/2020 1425  ? CALCIUM 9.1 07/06/2020 1425  ? PROT 7.1  07/06/2020 1425  ? PROT 7.3 06/19/2013 1600  ? ALBUMIN 3.9 07/06/2020 1425  ? AST 26 07/06/2020 1425  ? ALT 29 07/06/2020 1425  ? ALKPHOS 80 07/06/2020 1425  ? BILITOT 0.8 07/06/2020 1425  ? GFRNONAA >60 07/06/2020 1

## 2021-06-06 ENCOUNTER — Emergency Department (HOSPITAL_COMMUNITY)
Admission: EM | Admit: 2021-06-06 | Discharge: 2021-06-06 | Discharge status: Against Medical Advice | Payer: Medicare Other | Attending: Emergency Medicine | Admitting: Emergency Medicine

## 2021-06-06 ENCOUNTER — Other Ambulatory Visit: Payer: Self-pay

## 2021-06-06 ENCOUNTER — Other Ambulatory Visit (HOSPITAL_COMMUNITY): Payer: Self-pay | Admitting: Gerontology

## 2021-06-06 ENCOUNTER — Encounter (HOSPITAL_COMMUNITY): Payer: Self-pay

## 2021-06-06 ENCOUNTER — Other Ambulatory Visit: Payer: Self-pay | Admitting: "Endocrinology

## 2021-06-06 ENCOUNTER — Other Ambulatory Visit: Payer: Self-pay | Admitting: Gerontology

## 2021-06-06 DIAGNOSIS — R531 Weakness: Secondary | ICD-10-CM | POA: Diagnosis present

## 2021-06-06 DIAGNOSIS — M6281 Muscle weakness (generalized): Secondary | ICD-10-CM

## 2021-06-06 DIAGNOSIS — M5416 Radiculopathy, lumbar region: Secondary | ICD-10-CM

## 2021-06-06 DIAGNOSIS — Z5321 Procedure and treatment not carried out due to patient leaving prior to being seen by health care provider: Secondary | ICD-10-CM | POA: Diagnosis not present

## 2021-06-06 DIAGNOSIS — M79604 Pain in right leg: Secondary | ICD-10-CM

## 2021-06-06 NOTE — ED Notes (Signed)
No longer present in lobby.  

## 2021-06-06 NOTE — ED Triage Notes (Signed)
Pt c/o bilateral leg weakness and calf pain while walking "for a while" but increasing the last 2 days.  Pt reports significant weakness in R leg this morning.  Pain score 3/10.   Pt reports having back surgery last Fall.  Pt able to ambulate to triage room w/o assistance.

## 2021-06-07 ENCOUNTER — Emergency Department (HOSPITAL_COMMUNITY): Payer: Medicare Other

## 2021-06-07 ENCOUNTER — Observation Stay (HOSPITAL_COMMUNITY)
Admission: EM | Admit: 2021-06-07 | Discharge: 2021-06-08 | Disposition: A | Discharge status: Home / Self Care | Payer: Medicare Other | Attending: Family Medicine | Admitting: Family Medicine

## 2021-06-07 ENCOUNTER — Other Ambulatory Visit: Payer: Self-pay

## 2021-06-07 ENCOUNTER — Encounter (HOSPITAL_COMMUNITY): Payer: Self-pay | Admitting: Emergency Medicine

## 2021-06-07 DIAGNOSIS — I10 Essential (primary) hypertension: Secondary | ICD-10-CM | POA: Diagnosis not present

## 2021-06-07 DIAGNOSIS — R29898 Other symptoms and signs involving the musculoskeletal system: Secondary | ICD-10-CM

## 2021-06-07 DIAGNOSIS — K5090 Crohn's disease, unspecified, without complications: Secondary | ICD-10-CM | POA: Diagnosis not present

## 2021-06-07 DIAGNOSIS — E119 Type 2 diabetes mellitus without complications: Secondary | ICD-10-CM

## 2021-06-07 DIAGNOSIS — Z79899 Other long term (current) drug therapy: Secondary | ICD-10-CM | POA: Insufficient documentation

## 2021-06-07 DIAGNOSIS — Z87891 Personal history of nicotine dependence: Secondary | ICD-10-CM | POA: Diagnosis not present

## 2021-06-07 DIAGNOSIS — R531 Weakness: Secondary | ICD-10-CM | POA: Diagnosis present

## 2021-06-07 DIAGNOSIS — I639 Cerebral infarction, unspecified: Principal | ICD-10-CM

## 2021-06-07 DIAGNOSIS — E1169 Type 2 diabetes mellitus with other specified complication: Secondary | ICD-10-CM | POA: Insufficient documentation

## 2021-06-07 DIAGNOSIS — I63232 Cerebral infarction due to unspecified occlusion or stenosis of left carotid arteries: Secondary | ICD-10-CM | POA: Diagnosis not present

## 2021-06-07 DIAGNOSIS — I6522 Occlusion and stenosis of left carotid artery: Secondary | ICD-10-CM | POA: Diagnosis not present

## 2021-06-07 DIAGNOSIS — Z8673 Personal history of transient ischemic attack (TIA), and cerebral infarction without residual deficits: Secondary | ICD-10-CM | POA: Insufficient documentation

## 2021-06-07 DIAGNOSIS — I679 Cerebrovascular disease, unspecified: Secondary | ICD-10-CM

## 2021-06-07 DIAGNOSIS — R29701 NIHSS score 1: Secondary | ICD-10-CM

## 2021-06-07 DIAGNOSIS — E782 Mixed hyperlipidemia: Secondary | ICD-10-CM | POA: Diagnosis not present

## 2021-06-07 DIAGNOSIS — M5126 Other intervertebral disc displacement, lumbar region: Secondary | ICD-10-CM | POA: Insufficient documentation

## 2021-06-07 DIAGNOSIS — I63412 Cerebral infarction due to embolism of left middle cerebral artery: Principal | ICD-10-CM | POA: Insufficient documentation

## 2021-06-07 LAB — COMPREHENSIVE METABOLIC PANEL
ALT: 25 U/L (ref 0–44)
AST: 29 U/L (ref 15–41)
Albumin: 4.1 g/dL (ref 3.5–5.0)
Alkaline Phosphatase: 69 U/L (ref 38–126)
Anion gap: 8 (ref 5–15)
BUN: 14 mg/dL (ref 8–23)
CO2: 26 mmol/L (ref 22–32)
Calcium: 9.5 mg/dL (ref 8.9–10.3)
Chloride: 101 mmol/L (ref 98–111)
Creatinine, Ser: 1.1 mg/dL (ref 0.61–1.24)
GFR, Estimated: >60 mL/min (ref >60)
Glucose, Bld: 173 mg/dL — ABNORMAL HIGH (ref 70–99)
Potassium: 3.5 mmol/L (ref 3.5–5.1)
Sodium: 135 mmol/L (ref 135–145)
Total Bilirubin: 0.9 mg/dL (ref 0.3–1.2)
Total Protein: 7.7 g/dL (ref 6.5–8.1)

## 2021-06-07 LAB — DIFFERENTIAL
Abs Immature Granulocytes: 0.01 10*3/uL (ref 0.00–0.07)
Basophils Absolute: 0 10*3/uL (ref 0.0–0.1)
Basophils Relative: 0 %
Eosinophils Absolute: 0 10*3/uL (ref 0.0–0.5)
Eosinophils Relative: 0 %
Immature Granulocytes: 0 %
Lymphocytes Relative: 46 %
Lymphs Abs: 3 10*3/uL (ref 0.7–4.0)
Monocytes Absolute: 0.5 10*3/uL (ref 0.1–1.0)
Monocytes Relative: 7 %
Neutro Abs: 3.1 10*3/uL (ref 1.7–7.7)
Neutrophils Relative %: 47 %

## 2021-06-07 LAB — VITAMIN B12: Vitamin B-12: 794 pg/mL (ref 180–914)

## 2021-06-07 LAB — CBC
HCT: 43.7 % (ref 39.0–52.0)
Hemoglobin: 14.2 g/dL (ref 13.0–17.0)
MCH: 29.3 pg (ref 26.0–34.0)
MCHC: 32.5 g/dL (ref 30.0–36.0)
MCV: 90.1 fL (ref 80.0–100.0)
Platelets: 274 10*3/uL (ref 150–400)
RBC: 4.85 MIL/uL (ref 4.22–5.81)
RDW: 13.9 % (ref 11.5–15.5)
WBC: 6.7 10*3/uL (ref 4.0–10.5)
nRBC: 0 % (ref 0.0–0.2)

## 2021-06-07 LAB — PROTIME-INR
INR: 1.2 (ref 0.8–1.2)
Prothrombin Time: 15.4 seconds — ABNORMAL HIGH (ref 11.4–15.2)

## 2021-06-07 LAB — HEMOGLOBIN A1C
Hgb A1c MFr Bld: 6.2 % — ABNORMAL HIGH (ref 4.8–5.6)
Mean Plasma Glucose: 131.24 mg/dL

## 2021-06-07 LAB — MAGNESIUM: Magnesium: 2.1 mg/dL (ref 1.7–2.4)

## 2021-06-07 LAB — APTT: aPTT: 37 seconds — ABNORMAL HIGH (ref 24–36)

## 2021-06-07 LAB — CK: Total CK: 146 U/L (ref 49–397)

## 2021-06-07 LAB — TROPONIN I (HIGH SENSITIVITY)
Troponin I (High Sensitivity): 5 ng/L (ref <18)
Troponin I (High Sensitivity): 4 ng/L (ref <18)

## 2021-06-07 LAB — TSH: TSH: 1.38 u[IU]/mL (ref 0.350–4.500)

## 2021-06-07 MED ORDER — SODIUM CHLORIDE 0.9% FLUSH
3.0000 mL | Freq: Once | INTRAVENOUS | Status: AC
  Administered 2021-06-07: 3 mL via INTRAVENOUS

## 2021-06-07 MED ORDER — HYDRALAZINE HCL 20 MG/ML IJ SOLN: 10.0000 mg | Freq: Three times a day (TID) | INTRAMUSCULAR | Status: DC | PRN

## 2021-06-07 MED ORDER — ACETAMINOPHEN 325 MG PO TABS: 650.0000 mg | ORAL_TABLET | ORAL | Status: DC | PRN

## 2021-06-07 MED ORDER — STROKE: EARLY STAGES OF RECOVERY BOOK
Freq: Once | Status: AC
  Filled 2021-06-07: qty 1

## 2021-06-07 MED ORDER — ACETAMINOPHEN 160 MG/5ML PO SOLN: 650.0000 mg | ORAL | Status: DC | PRN

## 2021-06-07 MED ORDER — IOHEXOL 350 MG/ML SOLN
75.0000 mL | Freq: Once | INTRAVENOUS | Status: AC | PRN
  Administered 2021-06-07: 75 mL via INTRAVENOUS

## 2021-06-07 MED ORDER — ATORVASTATIN CALCIUM 40 MG PO TABS
40.0000 mg | ORAL_TABLET | ORAL | Status: DC
Start: 2021-06-08 — End: 2021-06-08

## 2021-06-07 MED ORDER — MESALAMINE 400 MG PO CPDR
1600.0000 mg | DELAYED_RELEASE_CAPSULE | Freq: Two times a day (BID) | ORAL | Status: DC
  Administered 2021-06-07 – 2021-06-08 (×2): 1600 mg via ORAL
  Filled 2021-06-07 (×3): qty 4

## 2021-06-07 MED ORDER — ACETAMINOPHEN 650 MG RE SUPP: 650.0000 mg | RECTAL | Status: DC | PRN

## 2021-06-07 MED ORDER — SENNOSIDES-DOCUSATE SODIUM 8.6-50 MG PO TABS: 1.0000 | ORAL_TABLET | Freq: Every evening | ORAL | Status: DC | PRN

## 2021-06-07 NOTE — ED Notes (Signed)
Patient transported to CT 

## 2021-06-07 NOTE — Assessment & Plan Note (Addendum)
Followed by neurosurgery in past No red flags on exam and symptoms improving, likely related to stroke CT lumbar ordered in ED and pending>no acute fractures. DDD.  Mild L2-L3, L3-L4 and L4-L5, central canal stenosis Multilevel neuroforaminal stenosis including moderate right and mild left L2-3, mild bilateral L3-4, and moderate left L5-S1 neuroforaminal stenosis.

## 2021-06-07 NOTE — H&P (Addendum)
History and Physical    Patient: Erik Holder KZS:[id number] DOB: 05/14/1945 DOA: 06/07/2021 DOS: the patient was seen and examined on 06/07/2021 PCP: Benetta Spar, MD  Patient coming from: Home - lives with wife and son. Independent with all ADLs.    Chief Complaint: right leg weakness and unsteady gait x 1 day   HPI: Erik Holder is a 76 y.o. male with medical history significant of T2DM, HTN, HLD, chron's disease on remicade q 2 months, hx of CVA who presented to ED with complaints of bilateral leg weakness x 7 days ago. Initially he thought it was due to his increase in lipitor. They then advised that he take this every other day. Two days ago he states his leg both felt odd and he thought he was going to fall. He changed shoes thinking that would help.  Yesterday he had right leg weakness that got progressively worse and was to the extent of dragging the ground and needing a cane. He also had some right arm weakness that felt heavy, but this seems to be doing better. He denies any focal deficits, speech deficits.    LKW: about one week ago    He has been feeling good. Denies any fever/chills, vision changes/headaches, chest pain or palpitations, shortness of breath or cough, abdominal pain, N/V/D, dysuria or leg swelling. He is quite fit. He walks 3-4 miles/day.    He does not smoke or drink alcohol. NO family history of CVA that he is aware of.    ER Course:  vitals: afebrile, bp: 181/88, HR; 107, RR: 18, oxygen: 100%RA Pertinent labs: glucose: 173,  CXR: no active disease CT head: probable old, small lacunar infarct in subcortical white matter of each frontal lobe, subacute infarct at either location not excluded.  MRI brain: acute perforator infarct at the left corona radiata.  In ED: neurology consulted. CTA head/neck ordered.     Review of Systems: As mentioned in the history of present illness. All other systems reviewed and are negative. Past  Medical History:  Diagnosis Date   Crohn's disease (HCC)    DM (diabetes mellitus) (HCC)    HTN (hypertension)    Macular cyst, hole, or pseudohole of retina    Neuropathy    Past Surgical History:  Procedure Laterality Date   CATARACT EXTRACTION     CERVICAL SPINE SURGERY     Social History:  reports that he quit smoking about 29 years ago. His smoking use included cigarettes. He has never used smokeless tobacco. He reports that he does not drink alcohol and does not use drugs.  Allergies  Allergen Reactions   Metformin Diarrhea    Family History  Problem Relation Age of Onset   Cancer Sister     Prior to Admission medications   Medication Sig Start Date End Date Taking? Authorizing Provider  atorvastatin (LIPITOR) 40 MG tablet Take 1 tablet by mouth every other day. 06/17/20   [provider]  Continuous Blood Gluc Receiver (FREESTYLE LIBRE 2 READER) DEVI As directed 05/08/21   Roma Kayser, MD  Continuous Blood Gluc Sensor (FREESTYLE LIBRE 2 SENSOR) MISC 1 Piece by Does not apply route every 14 (fourteen) days. 05/08/21   Roma Kayser, MD  losartan (COZAAR) 100 MG tablet Take 100 mg by mouth daily.    [provider]  Mesalamine 800 MG TBEC Take 2 tablets by mouth 2 (two) times daily. 05/03/20   [provider]    Physical Exam:  Vitals:   06/07/21 1015 06/07/21 1205 06/07/21 1215 06/07/21 1230  BP: 138/70 (!) 159/73 (!) 166/85 (!) 161/83  Pulse: 70 75 73 70  Resp: 16 (!) 21 20 14   Temp:      TempSrc:      SpO2: 99% 100% 100% 100%  Weight:      Height:       General:  Appears calm and comfortable and is in NAD Eyes:  PERRL, EOMI, normal lids, iris ENT:  grossly normal hearing, lips & tongue, mmm; appropriate dentition Neck:  no LAD, masses or thyromegaly; no carotid bruits Cardiovascular:  RRR, no m/r/g. No LE edema.  Respiratory:   CTA bilaterally with no wheezes/rales/rhonchi.  Normal respiratory effort. Abdomen:  soft,  NT, ND, NABS Back:   normal alignment, no CVAT Skin:  no rash or induration seen on limited exam Musculoskeletal:  grossly normal tone BUE/left LE: 5/5, RLE: 3/5, good ROM, no bony abnormality Lower extremity:  No LE edema.  Limited foot exam with no ulcerations.  2+ distal pulses. Psychiatric:  grossly normal mood and affect, speech fluent and appropriate, AOx3 Neurologic:  CN 2-12 grossly intact, moves all extremities in coordinated fashion, sensation intact/ HTK intact bilaterally. FTN intact bilaterally. DTR 2+, gait deferred    Radiological Exams on Admission: Independently reviewed - see discussion in A/P where applicable   CT HEAD WO CONTRAST  Result Date: 06/07/2021 CLINICAL DATA:  Neuro deficit.  Acute stroke suspected. EXAM: CT HEAD WITHOUT CONTRAST TECHNIQUE: Contiguous axial images were obtained from the base of the skull through the vertex without intravenous contrast. RADIATION DOSE REDUCTION: This exam was performed according to the departmental dose-optimization program which includes automated exposure control, adjustment of the mA and/or kV according to patient size and/or use of iterative reconstruction technique. COMPARISON:  None Available. FINDINGS: Brain: Mildly enlarged ventricles and cortical sulci. Mild patchy white matter low density in both cerebral hemispheres. Small area of more focal low-density in the left posterior frontal subcortical white matter and similar area in the right frontal subcortical white matter. No intracranial hemorrhage or mass lesion. Vascular: No hyperdense vessel or unexpected calcification. Skull: Normal. Negative for fracture or focal lesion. Sinuses/Orbits: Status post right cataract extraction. Unremarkable included paranasal sinuses. Other: None. IMPRESSION: 1. Probable old, small lacunar infarct in the subcortical white matter of each frontal lobe. A subacute infarct at either location is less likely but not excluded. 2. Mild diffuse cerebral  and cerebellar atrophy. 3. Mild chronic small-vessel white matter ischemic changes in both cerebral hemispheres. Electronically Signed   By: Beckie Salts M.D.   On: 06/07/2021 09:31   CT Lumbar Spine Wo Contrast  Result Date: 06/07/2021 CLINICAL DATA:  Lower back pain.  Increased fracture risk. EXAM: CT LUMBAR SPINE WITHOUT CONTRAST TECHNIQUE: Multidetector CT imaging of the lumbar spine was performed without intravenous contrast administration. Multiplanar CT image reconstructions were also generated. RADIATION DOSE REDUCTION: This exam was performed according to the departmental dose-optimization program which includes automated exposure control, adjustment of the mA and/or kV according to patient size and/or use of iterative reconstruction technique. COMPARISON:  MRI lumbar spine 07/02/2020 FINDINGS: Segmentation: 5 non-rib-bearing lumbar-type vertebral bodies. Alignment: No sagittal spondylolisthesis. No significant scoliotic curvature. Vertebrae: Vertebral body heights are maintained. Mild posterior L5-S1 disc space narrowing. Mild-to-moderate anterior L2-3 and L3-4 endplate osteophytes. Mild L3-4 and L5-S1 degenerative vacuum disc. 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 No acute fracture. Paraspinal and other soft tissues: Mild atherosclerotic calcifications within the abdominal aorta.  Disc levels: T11-12: Right-greater-than-left facet joint hypertrophy results in mild right neuroforaminal stenosis. No central canal stenosis. T12-L1: No central canal or neuroforaminal stenosis. L1-2: Mild bilateral facet joint hypertrophy. No central canal or neuroforaminal stenosis. L2-3: Mild-to-moderate bilateral facet joint hypertrophy. Minimal broad-based posterior disc osteophyte complex. Mild central canal stenosis. Moderate right and mild left neuroforaminal stenosis. L3-4: Mild-to-moderate bilateral facet joint hypertrophy. Mild broad-based posterior disc osteophyte complex. Mild  bilateral neuroforaminal stenosis. Borderline mild central canal stenosis. L4-5: Mild-to-moderate bilateral facet joint hypertrophy. Mild broad-based posterior disc osteophyte complex. No significant neuroforaminal stenosis. Mild left-greater-than-right lateral recess narrowing and borderline mild central canal stenosis. L5-S1: Mild-to-moderate bilateral facet joint hypertrophy. Mild broad-based posterior disc osteophyte complex. Left intraforaminal disc and endplate spurring and moderate left neuroforaminal stenosis. No central canal stenosis. IMPRESSION: 1. Multilevel degenerative disc and joint changes as above. No acute fracture. 2. Mild L2-3 L3-4 and L4-5 central canal stenosis. 3. Multilevel neuroforaminal stenosis including moderate right and mild left L2-3, mild bilateral L3-4, and moderate left L5-S1 neuroforaminal stenosis. Electronically Signed   By: Neita Garnet M.D.   On: 06/07/2021 13:22   MR BRAIN WO CONTRAST  Result Date: 06/07/2021 CLINICAL DATA:  Neuro deficit with acute stroke suspected EXAM: MRI HEAD WITHOUT CONTRAST TECHNIQUE: Multiplanar, multiecho pulse sequences of the brain and surrounding structures were obtained without intravenous contrast. COMPARISON:  06/28/2013 FINDINGS: Brain: Wedge of restricted diffusion in the left corona radiata. Background of mild chronic small vessel ischemic gliosis for age. Chronic lacunar infarcts in the bilateral thalamus, right putamen, and left corona radiata. No acute hemorrhage, hydrocephalus, or masslike finding. Vascular: Major flow voids are preserved Skull and upper cervical spine: Normal marrow signal Sinuses/Orbits: Right cataract resection IMPRESSION: 1. Acute perforator infarct at the left corona radiata. 2. Chronic small vessel ischemia with remote lacunar infarcts. Electronically Signed   By: Tiburcio Pea M.D.   On: 06/07/2021 11:24   DG Chest Portable 1 View  Result Date: 06/07/2021 CLINICAL DATA:  CVA EXAM: PORTABLE CHEST 1 VIEW  COMPARISON:  12/30/2016 FINDINGS: Artifact from EKG leads. Normal heart size and mediastinal contours. No acute infiltrate or edema. No effusion or pneumothorax. No acute osseous findings. IMPRESSION: No evidence of active disease. Electronically Signed   By: Tiburcio Pea M.D.   On: 06/07/2021 09:00    EKG: Independently reviewed.  NSR with rate 96; nonspecific ST changes with no evidence of acute ischemia   Labs on Admission: I have personally reviewed the available labs and imaging studies at the time of the admission.  Pertinent labs:   Glucose 173  Assessment and Plan: Principal Problem:   Acute CVA (cerebrovascular accident) (HCC) Active Problems:   Diabetes mellitus without complication (HCC)   Carotid artery stenosis, asymptomatic, left   Essential hypertension, benign   Mixed hyperlipidemia   Crohn's disease (HCC)   Herniation of nucleus pulposus, lumbar    Assessment and Plan: * Acute CVA (cerebrovascular accident) (HCC) 76 year old male with history of T2DM, HTN, HLD, hx of past CVA who presented with right leg weakness was found to have acute left corona radiata infarct.  -admit to telemetry for stroke work-up -Neurochecks per protocol -Neurology consulted -MRI brain without contrast has been done -CTA head/neck pending -echo -lipid/a1c -will also check TSH/b12 -on remicade q 2 months x 23 years. CVA serious AE  -ASA/plavix per neurology with hx of chron's disease  -Permissive hypertension first 24 hours <220/110 -N.p.o. until bedside swallow screen -PT/ OT/ SLP consult   Carotid  artery stenosis, asymptomatic, left F/u on CTA neck, no bruit on exam   Diabetes mellitus without complication (HCC) A1C was 5.1 in 06/2020 Update A1C with acute CVA  Appears diet controlled SSI and accuchecks per protocol   Essential hypertension, benign Allow for permissive HTN in setting of acute CVA for next 24-48 hours Prn hydralazine for sbp >220  Hold home cozaar    Mixed hyperlipidemia Lipid panel in AM continue lipitor 40mg  for now. Taking every other day   Crohn's disease (HCC) Continue mesalamine, no recent flairs  On remicade infusion q 2 months  ASA/plavix per neurology   Herniation of nucleus pulposus, lumbar Followed by neurosurgery in past No red flags on exam and symptoms improving, likely related to stroke CT lumbar ordered in ED and pending>no acute fractures. DDD.  Mild L2-L3, L3-L4 and L4-L5, central canal stenosis Multilevel neuroforaminal stenosis including moderate right and mild left L2-3, mild bilateral L3-4, and moderate left L5-S1 neuroforaminal stenosis.      Advance Care Planning:   Code Status: Full Code   Consults: neurology: Dr.   DVT Prophylaxis: SCDs   Family Communication: wife at bedside: Kidist Oyen   Severity of Illness: The appropriate patient status for this patient is INPATIENT. Inpatient status is judged to be reasonable and necessary in order to provide the required intensity of service to ensure the patient's safety. The patient's presenting symptoms, physical exam findings, and initial radiographic and laboratory data in the context of their chronic comorbidities is felt to place them at high risk for further clinical deterioration. Furthermore, it is not anticipated that the patient will be medically stable for discharge from the hospital within 2 midnights of admission.   * I certify that at the point of admission it is my clinical judgment that the patient will require inpatient hospital care spanning beyond 2 midnights from the point of admission due to high intensity of service, high risk for further deterioration and high frequency of surveillance required.*  Author: Thomasena Edis, MD 06/07/2021 2:38 PM  For on call review [email or web address].

## 2021-06-07 NOTE — Assessment & Plan Note (Addendum)
Lipid panel in AM continue lipitor 40mg  for now. Taking every other day

## 2021-06-07 NOTE — Assessment & Plan Note (Addendum)
76 year old male with history of T2DM, HTN, HLD, hx of past CVA who presented with right leg weakness was found to have acute left corona radiata infarct.  -admit to telemetry for stroke work-up -Neurochecks per protocol -Neurology consulted -MRI brain without contrast has been done -CTA head/neck pending -echo -lipid/a1c -will also check TSH/b12 -on remicade q 2 months x 23 years. CVA serious AE  -ASA/plavix per neurology with hx of chron's disease  -Permissive hypertension first 24 hours <220/110 -N.p.o. until bedside swallow screen -PT/ OT/ SLP consult

## 2021-06-07 NOTE — ED Provider Notes (Signed)
[facility] EMERGENCY DEPARTMENT Provider Note   CSN: [id number] Arrival date & time: 06/07/21  0741     History  Chief Complaint  Patient presents with   Extremity Weakness    Erik Holder is a 76 y.o. male.   Patient as above with significant medical history as below, including well-controlled Crohn's disease, diabetes mellitus, hypertension, neuropathy who presents to the ED with complaint of weakness.  Patient reports over the past week he has been having weakness to his bilateral lower extremities.  Yesterday around 8 AM after eating breakfast he attempted to ambulate and felt as though his right leg was dragging on the ground, unable to pick up his right leg.  He also noticed right hand heaviness at that time as well.  Symptoms have not significantly improved or worsened since the onset.  Not associated with vision changes.  No falls.  He has history of spinal surgery, no significant change to his chronic back pain, no falls or injuries.  No saddle paresthesias, no urinary overflow or bowel incontinence.  No IV drug use, fevers or chills.  No nausea, vomiting or diarrhea.  He is compliant with his home medications  Patient is on a statin, when he began to have the leg weakness bilateral his statin was reduced by PCP, also was sent for B12 level- B12 was 820 yesterday.   Care Everywhere does show patient with admission for left thalamic infarct around 1 year ago at [facility]   Past Medical History:  Diagnosis Date   Crohn's disease ([place])    DM (diabetes mellitus) ([place])    HTN (hypertension)    Macular cyst, hole, or pseudohole of retina    Neuropathy     Past Surgical History:  Procedure Laterality Date   CATARACT EXTRACTION     CERVICAL SPINE SURGERY       The history is provided by the patient. No language interpreter was used.  Extremity Weakness Pertinent negatives include no chest pain, no abdominal pain, no headaches and no shortness of breath.       Home Medications Prior to Admission medications   Medication Sig Start Date End Date Taking? Authorizing Provider  atorvastatin (LIPITOR) 40 MG tablet Take 1 tablet by mouth every other day. 06/17/20   [provider]  Continuous Blood Gluc Receiver (FREESTYLE LIBRE 2 READER) DEVI As directed 05/08/21   Cassandria Anger, MD  Continuous Blood Gluc Sensor (FREESTYLE LIBRE 2 SENSOR) MISC 1 Piece by Does not apply route every 14 (fourteen) days. 05/08/21   Cassandria Anger, MD  losartan (COZAAR) 100 MG tablet Take 100 mg by mouth daily.    [provider]  Mesalamine 800 MG TBEC Take 2 tablets by mouth 2 (two) times daily. 05/03/20   [provider]      Allergies    Metformin    Review of Systems   Review of Systems  Constitutional:  Negative for chills and fever.  HENT:  Negative for facial swelling and trouble swallowing.   Eyes:  Negative for photophobia and visual disturbance.  Respiratory:  Negative for cough and shortness of breath.   Cardiovascular:  Negative for chest pain and palpitations.  Gastrointestinal:  Negative for abdominal pain, nausea and vomiting.  Endocrine: Negative for polydipsia and polyuria.  Genitourinary:  Negative for difficulty urinating and hematuria.  Musculoskeletal:  Positive for extremity weakness. Negative for gait problem and joint swelling.  Skin:  Negative for pallor and rash.  Neurological:  Positive for weakness and numbness. Negative for syncope and headaches.  Psychiatric/Behavioral:  Negative for agitation and confusion.    Physical Exam Updated Vital Signs BP (!) 161/83   Pulse 70   Temp 97.9 F (36.6 C) (Oral)   Resp 14   Ht 5\' 7"  (1.702 m)   Wt 65.3 kg   SpO2 100%   BMI 22.55 kg/m  Physical Exam Vitals and nursing note reviewed.  Constitutional:      General: He is not in acute distress.    Appearance: Normal appearance. He is well-developed. He is not ill-appearing or diaphoretic.  HENT:      Head: Normocephalic and atraumatic.     Right Ear: External ear normal.     Left Ear: External ear normal.     Mouth/Throat:     Mouth: Mucous membranes are moist.  Eyes:     General: No scleral icterus.    Extraocular Movements: Extraocular movements intact.     Pupils: Pupils are equal, round, and reactive to light.  Cardiovascular:     Rate and Rhythm: Normal rate and regular rhythm.     Pulses: Normal pulses.     Heart sounds: Normal heart sounds.  Pulmonary:     Effort: Pulmonary effort is normal. No respiratory distress.     Breath sounds: Normal breath sounds.  Abdominal:     General: Abdomen is flat.     Palpations: Abdomen is soft.     Tenderness: There is no abdominal tenderness.  Musculoskeletal:        General: Normal range of motion.     Cervical back: Normal range of motion.     Right lower leg: No edema.     Left lower leg: No edema.  Skin:    General: Skin is warm and dry.     Capillary Refill: Capillary refill takes less than 2 seconds.  Neurological:     Mental Status: He is alert and oriented to person, place, and time. Mental status is at baseline.     GCS: GCS eye subscore is 4. GCS verbal subscore is 5. GCS motor subscore is 6.     Cranial Nerves: Cranial nerves 2-12 are intact. No dysarthria or facial asymmetry.     Sensory: Sensory deficit present.     Motor: Weakness present.     Coordination: Coordination abnormal.     Comments: Reduced sensation to two-point discrimination right upper extremity and right lower extremity.  Also mildly reduced to lower right portion of face  Reduced strength to right lower extremity.  There is dysmetria noted to right upper extremity on finger-nose testing  Gait not tested secondary to patient safety  NIHSS 4, sensation, ataxia   Psychiatric:        Mood and Affect: Mood normal.        Behavior: Behavior normal.    ED Results / Procedures / Treatments   Labs (all labs ordered are listed, but only  abnormal results are displayed) Labs Reviewed  PROTIME-INR - Abnormal; Notable for the following components:      Result Value   Prothrombin Time 15.4 (*)    All other components within normal limits  APTT - Abnormal; Notable for the following components:   aPTT 37 (*)    All other components within normal limits  COMPREHENSIVE METABOLIC PANEL - Abnormal; Notable for the following components:   Glucose, Bld 173 (*)    All other components within normal limits  CBC  DIFFERENTIAL  MAGNESIUM  CK  HEMOGLOBIN A1C  TSH  VITAMIN B12  TROPONIN I (HIGH SENSITIVITY)  TROPONIN I (HIGH SENSITIVITY)    EKG EKG Interpretation  Date/Time:  Saturday Jun 07 2021 07:55:29 EDT Ventricular Rate:  96 PR Interval:  218 QRS Duration: 84 QT Interval:  360 QTC Calculation: 455 R Axis:   48 Text Interpretation: Sinus rhythm Prolonged PR interval Confirmed by Wynona Dove ([id number]) on 06/07/2021 11:24:22 AM  Radiology CT ANGIO HEAD NECK W WO CM  Result Date: 06/07/2021 CLINICAL DATA:  Neuro deficit, acute, stroke suspected EXAM: CT ANGIOGRAPHY HEAD AND NECK TECHNIQUE: Multidetector CT imaging of the head and neck was performed using the standard protocol during bolus administration of intravenous contrast. Multiplanar CT image reconstructions and MIPs were obtained to evaluate the vascular anatomy. Carotid stenosis measurements (when applicable) are obtained utilizing NASCET criteria, using the distal internal carotid diameter as the denominator. RADIATION DOSE REDUCTION: This exam was performed according to the departmental dose-optimization program which includes automated exposure control, adjustment of the mA and/or kV according to patient size and/or use of iterative reconstruction technique. CONTRAST:  67mL OMNIPAQUE IOHEXOL 350 MG/ML SOLN 75 mL of Omnipaque 350 IV. COMPARISON:  CT head May 27, 23. FINDINGS: CTA NECK FINDINGS Aortic arch: Great vessel origins are patent without significant stenosis.  Right carotid system: Atherosclerosis at the carotid bifurcation without greater than 50% stenosis relative to the distal vessel. Left carotid system: Atherosclerosis at the carotid bifurcation with approximately 50% stenosis of the ICA origin. Vertebral arteries: Right dominant vertebral artery. Moderate stenosis of the right vertebral artery origin due to atherosclerosis. Otherwise, vertebral arteries are patent without significant stenosis. Skeleton: ACDF spanning C5-C7 with solid bony fusion. Other neck: Approximately 1.7 x 1.0 cm right-sided tracheal diverticulum at the level of the thyroid (series 6, image 75). Upper chest: Biapical pleuroparenchymal scarring. Otherwise, visualized lung apices are clear. Review of the MIP images confirms the above findings CTA HEAD FINDINGS Anterior circulation: Calcific atherosclerosis of bilateral intracranial ICAs with mild resulting narrowing. Bilateral MCAs and ACAs are patent without proximal hemodynamically significant stenosis. Approximately 2 mm inferiorly directed outpouching arising from the proximal right cavernous ICA. Posterior circulation: Bilateral intradural vertebral arteries, basilar artery and both posterior cerebral arteries are patent. Moderate bilateral P2 PCA stenosis. Venous sinuses: As permitted by contrast timing, patent. Anatomic variants: Detailed above. Review of the MIP images confirms the above findings IMPRESSION: 1. No emergent large vessel occlusion. 2. Moderate bilateral P2 PCA stenosis. 3. Bilateral carotid bifurcation atherosclerosis with approximately 50% stenosis of the left ICA origin. 4. Approximately 2 mm inferiorly directed outpouching arising from the proximal right cavernous ICA, compatible with aneurysm versus infundibulum with vessel not well seen. 5. Approximately 1.7 x 1.0 cm right-sided tracheal diverticulum. Electronically Signed   By: Margaretha Sheffield M.D.   On: 06/07/2021 13:25   CT HEAD WO CONTRAST  Result Date:  06/07/2021 CLINICAL DATA:  Neuro deficit.  Acute stroke suspected. EXAM: CT HEAD WITHOUT CONTRAST TECHNIQUE: Contiguous axial images were obtained from the base of the skull through the vertex without intravenous contrast. RADIATION DOSE REDUCTION: This exam was performed according to the departmental dose-optimization program which includes automated exposure control, adjustment of the mA and/or kV according to patient size and/or use of iterative reconstruction technique. COMPARISON:  None Available. FINDINGS: Brain: Mildly enlarged ventricles and cortical sulci. Mild patchy white matter low density in both cerebral hemispheres. Small area of more focal low-density in the left posterior frontal subcortical white matter and  similar area in the right frontal subcortical white matter. No intracranial hemorrhage or mass lesion. Vascular: No hyperdense vessel or unexpected calcification. Skull: Normal. Negative for fracture or focal lesion. Sinuses/Orbits: Status post right cataract extraction. Unremarkable included paranasal sinuses. Other: None. IMPRESSION: 1. Probable old, small lacunar infarct in the subcortical white matter of each frontal lobe. A subacute infarct at either location is less likely but not excluded. 2. Mild diffuse cerebral and cerebellar atrophy. 3. Mild chronic small-vessel white matter ischemic changes in both cerebral hemispheres. Electronically Signed   By: Claudie Revering M.D.   On: 06/07/2021 09:31   CT Lumbar Spine Wo Contrast  Result Date: 06/07/2021 CLINICAL DATA:  Lower back pain.  Increased fracture risk. EXAM: CT LUMBAR SPINE WITHOUT CONTRAST TECHNIQUE: Multidetector CT imaging of the lumbar spine was performed without intravenous contrast administration. Multiplanar CT image reconstructions were also generated. RADIATION DOSE REDUCTION: This exam was performed according to the departmental dose-optimization program which includes automated exposure control, adjustment of the mA  and/or kV according to patient size and/or use of iterative reconstruction technique. COMPARISON:  MRI lumbar spine 07/02/2020 FINDINGS: Segmentation: 5 non-rib-bearing lumbar-type vertebral bodies. Alignment: No sagittal spondylolisthesis. No significant scoliotic curvature. Vertebrae: Vertebral body heights are maintained. Mild posterior L5-S1 disc space narrowing. Mild-to-moderate anterior L2-3 and L3-4 endplate osteophytes. Mild L3-4 and L5-S1 degenerative vacuum disc. 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 No acute fracture. Paraspinal and other soft tissues: Mild atherosclerotic calcifications within the abdominal aorta. Disc levels: T11-12: Right-greater-than-left facet joint hypertrophy results in mild right neuroforaminal stenosis. No central canal stenosis. T12-L1: No central canal or neuroforaminal stenosis. L1-2: Mild bilateral facet joint hypertrophy. No central canal or neuroforaminal stenosis. L2-3: Mild-to-moderate bilateral facet joint hypertrophy. Minimal broad-based posterior disc osteophyte complex. Mild central canal stenosis. Moderate right and mild left neuroforaminal stenosis. L3-4: Mild-to-moderate bilateral facet joint hypertrophy. Mild broad-based posterior disc osteophyte complex. Mild bilateral neuroforaminal stenosis. Borderline mild central canal stenosis. L4-5: Mild-to-moderate bilateral facet joint hypertrophy. Mild broad-based posterior disc osteophyte complex. No significant neuroforaminal stenosis. Mild left-greater-than-right lateral recess narrowing and borderline mild central canal stenosis. L5-S1: Mild-to-moderate bilateral facet joint hypertrophy. Mild broad-based posterior disc osteophyte complex. Left intraforaminal disc and endplate spurring and moderate left neuroforaminal stenosis. No central canal stenosis. IMPRESSION: 1. Multilevel degenerative disc and joint changes as above. No acute fracture. 2. Mild L2-3 L3-4 and L4-5 central canal  stenosis. 3. Multilevel neuroforaminal stenosis including moderate right and mild left L2-3, mild bilateral L3-4, and moderate left L5-S1 neuroforaminal stenosis. Electronically Signed   By: Yvonne Kendall M.D.   On: 06/07/2021 13:22   MR BRAIN WO CONTRAST  Result Date: 06/07/2021 CLINICAL DATA:  Neuro deficit with acute stroke suspected EXAM: MRI HEAD WITHOUT CONTRAST TECHNIQUE: Multiplanar, multiecho pulse sequences of the brain and surrounding structures were obtained without intravenous contrast. COMPARISON:  06/28/2013 FINDINGS: Brain: Wedge of restricted diffusion in the left corona radiata. Background of mild chronic small vessel ischemic gliosis for age. Chronic lacunar infarcts in the bilateral thalamus, right putamen, and left corona radiata. No acute hemorrhage, hydrocephalus, or masslike finding. Vascular: Major flow voids are preserved Skull and upper cervical spine: Normal marrow signal Sinuses/Orbits: Right cataract resection IMPRESSION: 1. Acute perforator infarct at the left corona radiata. 2. Chronic small vessel ischemia with remote lacunar infarcts. Electronically Signed   By: Jorje Guild M.D.   On: 06/07/2021 11:24   DG Chest Portable 1 View  Result Date: 06/07/2021 CLINICAL DATA:  CVA EXAM: PORTABLE CHEST 1  VIEW COMPARISON:  12/30/2016 FINDINGS: Artifact from EKG leads. Normal heart size and mediastinal contours. No acute infiltrate or edema. No effusion or pneumothorax. No acute osseous findings. IMPRESSION: No evidence of active disease. Electronically Signed   By: Jorje Guild M.D.   On: 06/07/2021 09:00    Procedures Procedures    Medications Ordered in ED Medications   stroke: early stages of recovery book (has no administration in time range)  acetaminophen (TYLENOL) tablet 650 mg (has no administration in time range)    Or  acetaminophen (TYLENOL) 160 MG/5ML solution 650 mg (has no administration in time range)    Or  acetaminophen (TYLENOL) suppository 650 mg  (has no administration in time range)  senna-docusate (Senokot-S) tablet 1 tablet (has no administration in time range)  hydrALAZINE (APRESOLINE) injection 10 mg (has no administration in time range)  sodium chloride flush (NS) 0.9 % injection 3 mL (3 mLs Intravenous Given 06/07/21 0811)  iohexol (OMNIPAQUE) 350 MG/ML injection 75 mL (75 mLs Intravenous Contrast Given 06/07/21 1301)    ED Course/ Medical Decision Making/ A&P                           Medical Decision Making Amount and/or Complexity of Data Reviewed Labs: ordered. Radiology: ordered.  Risk Prescription drug management. Decision regarding hospitalization.    CC: Weakness  This patient presents to the Emergency Department for the above complaint. This involves an extensive number of treatment options and is a complaint that carries with it a high risk of complications and morbidity. Vital signs were reviewed. Serious etiologies considered.  DDx includes not limited to, electrolyte derangement, CVA, infection, infarction, metabolic, endocrine, other acute pathologies  patient does have focal deficits on neuro testing, he is not TNK candidate given symptom onset greater than 24 hours ago. Not thrombectomy candidate either given symptom duration. LKN >24 hours ago  Record review:  Previous records obtained and reviewed  Prior labs/imaging, prior office notes, prior ed visits, care everywhere  Additional history obtained from spouse  Medical and surgical history as noted above.   Work up as above, notable for:   Labs & imaging results that were available during my care of the patient were visualized by me and considered in my medical decision making.   I ordered imaging studies which included CT head without, chest x-ray, MRI brain without, CT lumbar spine without, CT angio head and neck. I visualized the imaging, interpreted images, and I agree with radiologist interpretation.  Noncon CT head with a chronic lacunar  infarct bilateral frontal lobe.  MRI brain with acute perforator infarct left corona radiata.   Cardiac monitoring reviewed and interpreted personally which shows NSR  Personally discussed patient care with consultant; Dr Theda Sers with neurology.  Labs reviewed  Reassessment:  Pt to ED with stroke like symptoms.  Found to have acute stroke on MRI; he is outside of TNK treatment window.  Discussed with neurology, agree with admission; stroke work-up. Discussed with Dr. Eliberto Ivory accepts patient for admission.                 Social determinants of health include -  Social History   Socioeconomic History   Marital status: Married    Spouse name: Not on file   Number of children: Not on file   Years of education: Not on file   Highest education level: Not on file  Occupational History   Not on file  Tobacco  Use   Smoking status: Former    Types: Cigarettes    Quit date: 08/20/1991    Years since quitting: 29.8   Smokeless tobacco: Never  Substance and Sexual Activity   Alcohol use: No   Drug use: No   Sexual activity: Not on file  Other Topics Concern   Not on file  Social History Narrative   Not on file   Social Determinants of Health   Financial Resource Strain: Not on file  Food Insecurity: Not on file  Transportation Needs: Not on file  Physical Activity: Not on file  Stress: Not on file  Social Connections: Not on file  Intimate Partner Violence: Not on file      This chart was dictated using voice recognition software.  Despite best efforts to proofread,  errors can occur which can change the documentation meaning.         Final Clinical Impression(s) / ED Diagnoses Final diagnoses:  Cerebrovascular accident (CVA), unspecified mechanism ([place])  Weakness    Rx / DC Orders ED Discharge Orders     None         Jeanell Sparrow, DO 06/07/21 1403

## 2021-06-07 NOTE — Assessment & Plan Note (Addendum)
Continue mesalamine, no recent flairs  On remicade infusion q 2 months  ASA/plavix per neurology

## 2021-06-07 NOTE — ED Notes (Signed)
Pt returns from mri 

## 2021-06-07 NOTE — ED Notes (Signed)
Patient transported to MRI 

## 2021-06-07 NOTE — Assessment & Plan Note (Signed)
F/u on CTA neck, no bruit on exam

## 2021-06-07 NOTE — ED Notes (Signed)
PT transported to MRI

## 2021-06-07 NOTE — ED Triage Notes (Signed)
Pt is a physician.  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 R leg weakness has progressed and pt had unsteady gait since yesterday.  Numbness to R hand this morning.  No arm drift.

## 2021-06-07 NOTE — Assessment & Plan Note (Addendum)
Allow for permissive HTN in setting of acute CVA for next 24-48 hours Prn hydralazine for sbp >220  Hold home cozaar

## 2021-06-07 NOTE — Assessment & Plan Note (Signed)
A1C was 5.1 in 06/2020 Update A1C with acute CVA  Appears diet controlled SSI and accuchecks per protocol

## 2021-06-07 NOTE — Consult Note (Signed)
Neurology stroke Consult H&P  MANKIRAT IGLESIAS MR# [id number] 06/07/2021   CC: 1 week of bilateral lower extremity weakness found to have stroke  History is obtained from: Patient, wife and chart.  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 Initially it was thought to be due to Lipitor and the dose was changed to every other day however no improvement was noted. 2 days ago his right leg became numb and a little weaker than the left however he has history of right L5-S1 surgery which confounded the picture. Yesterday his right leg was significantly weaker and with more numbness such that it felt heavy and he at times had to drag it. This was concerning to him and he presented to ED for further evaluation.    LKW: unclear tNK given: No OSW IR Thrombectomy No, not indicated Modified Rankin Scale: 0-Completely asymptomatic and back to baseline post- stroke NIHSS: 1 sensory  ROS: A complete ROS was performed and is negative except as noted in the HPI.   Past Medical History:  Diagnosis Date   Crohn's disease ([place])    DM (diabetes mellitus) ([place])    HTN (hypertension)    Macular cyst, hole, or pseudohole of retina    Neuropathy      Family History  Problem Relation Age of Onset   Cancer Sister     Social History:  reports that he quit smoking about 29 years ago. His smoking use included cigarettes. He has never used smokeless tobacco. He reports that he does not drink alcohol and does not use drugs.   Prior to Admission medications   Medication Sig Start Date End Date Taking? Authorizing Provider  atorvastatin (LIPITOR) 40 MG tablet Take 1 tablet by mouth every other day. 06/17/20  Yes [provider]  cholecalciferol (VITAMIN D3) 25 MCG (1000 UNIT) tablet Take 1,000 Units by mouth daily.   Yes [provider]  losartan (COZAAR) 100 MG  tablet Take 100 mg by mouth daily.   Yes [provider]  Mesalamine 800 MG TBEC Take 1,600 mg by mouth 2 (two) times daily. 05/03/20  Yes [provider]  vitamin B-12 (CYANOCOBALAMIN) 1000 MCG tablet Take 1,000 mcg by mouth daily.   Yes [provider]  Continuous Blood Gluc Receiver (FREESTYLE LIBRE 2 READER) DEVI As directed 05/08/21   Cassandria Anger, MD  Continuous Blood Gluc Sensor (FREESTYLE LIBRE 2 SENSOR) MISC 1 Piece by Does not apply route every 14 (fourteen) days. 05/08/21   Cassandria Anger, MD    Exam: Current vital signs: BP (!) 161/83   Pulse 70   Temp 97.9 F (36.6 C) (Oral)   Resp 14   Ht 5\' 7"  (1.702 m)   Wt 65.3 kg   SpO2 100%   BMI 22.55 kg/m   Physical Exam  Constitutional: Appears well-developed and well-nourished.  Psych: Affect appropriate to situation Eyes: No scleral injection HENT: No OP obstruction. Head: Normocephalic.  Cardiovascular: Normal rate and regular rhythm.  Respiratory: Effort normal, symmetric excursions bilaterally, no audible wheezing. GI: Soft.  No distension. There is no tenderness.  Skin: WDI  Neuro: Mental Status: Patient is awake, alert, oriented to person, place, month, year, and situation. Patient is able to give a clear and coherent history. Speech fluent, intact comprehension and repetition. No signs of aphasia or neglect. Visual Fields are full. Pupils are equal, round, and  reactive to light. EOMI without ptosis or diplopia.  Facial sensation is symmetric to temperature Facial movement is symmetric.  Hearing is intact to voice. Uvula midline and palate elevates symmetrically. Shoulder shrug is symmetric. Tongue is midline without atrophy or fasciculations.  Tone is normal. Bulk is normal. 5/5 strength was present in all four extremities. Sensation is very mildly decreased in left leg to temperature  Deep Tendon Reflexes: 2+ and symmetric in the biceps and patellae. Babinski (+)  R FNF and HKS are intact bilaterally. Gait - Deferred  I have reviewed labs in epic and the pertinent results are:   I have reviewed the images obtained: NCT head showed probable old, small lacunar infarct in the subcortical white matter of each frontal lobe.  CTA head and neck showed no LVO, moderate bilateral P2 PCA stenosis. Bilateral carotid bifurcation atherosclerosis with approximately 50% stenosis of the left ICA origin. 4. Approximately 2 mm inferiorly directed outpouching arising from the proximal right cavernous ICA, compatible with aneurysm versus infundibulum with vessel not well seen.  Assessment: Erik Holder is a 76 y.o. male PMHx Chron's disease well controlled, DM2 (improved with diet and exercise), HTN (improved with diet and exercise) found to have acute embolic strokes. His vascular risk factors are improved such that he was able to wean off one antihypertensive medication as well as metformin MRI showed significant white matter disease and CT head showed two chronic lanunar infarctions. Additionally, he had bilateral P2 stenoses and will need admission for further evaluation. He stated that his Chron's disease is controlled and he would be willing to try antiplatelet.   Impression:  Acute left MCA embolic strokes Chronic bilateral frontal lobe lacunar infarctions (known) Bilateral P2 stenoses. Asymptomatic carotid artery disease HTN HLD DM2 Chron's disease NIHSS 1  Plan: - Recommend TTE. - Recommend labs: HbA1c, lipid panel. - Recommend Statin for goal LDL <70. - Goal A1c <7. - Consider aspirin 81mg  daily. - SBP goal <130/90. - Telemetry monitoring for arrhythmia. - Recommend bedside Swallow screen. - Recommend Stroke education. - Recommend PT/OT/SLP consult.   This patient is critically ill and at significant risk of neurological worsening, death and care requires constant monitoring of vital signs, hemodynamics,respiratory and cardiac monitoring,  neurological assessment, discussion with family, other specialists and medical decision making of high complexity. I spent 73 minutes of neurocritical care time  in the care of  this patient. This was time spent independent of any time provided by nurse practitioner or PA.  Electronically signed by:  Lynnae Sandhoff, MD Page: [phone] 06/07/2021, 3:10 PM  If 7pm- 7am, please page neurology on call as listed in [place].

## 2021-06-08 ENCOUNTER — Inpatient Hospital Stay (HOSPITAL_BASED_OUTPATIENT_CLINIC_OR_DEPARTMENT_OTHER): Payer: Medicare Other

## 2021-06-08 DIAGNOSIS — I639 Cerebral infarction, unspecified: Secondary | ICD-10-CM | POA: Diagnosis not present

## 2021-06-08 DIAGNOSIS — I6389 Other cerebral infarction: Secondary | ICD-10-CM

## 2021-06-08 DIAGNOSIS — K5090 Crohn's disease, unspecified, without complications: Secondary | ICD-10-CM | POA: Diagnosis not present

## 2021-06-08 DIAGNOSIS — E119 Type 2 diabetes mellitus without complications: Secondary | ICD-10-CM | POA: Diagnosis not present

## 2021-06-08 DIAGNOSIS — I6522 Occlusion and stenosis of left carotid artery: Secondary | ICD-10-CM | POA: Diagnosis not present

## 2021-06-08 DIAGNOSIS — I63412 Cerebral infarction due to embolism of left middle cerebral artery: Secondary | ICD-10-CM | POA: Diagnosis not present

## 2021-06-08 DIAGNOSIS — M5126 Other intervertebral disc displacement, lumbar region: Secondary | ICD-10-CM

## 2021-06-08 DIAGNOSIS — I10 Essential (primary) hypertension: Secondary | ICD-10-CM | POA: Diagnosis not present

## 2021-06-08 LAB — LIPID PANEL
Cholesterol: 163 mg/dL (ref 0–200)
HDL: 49 mg/dL (ref >40)
LDL Cholesterol: 98 mg/dL (ref 0–99)
Total CHOL/HDL Ratio: 3.3 RATIO
Triglycerides: 79 mg/dL (ref <150)
VLDL: 16 mg/dL (ref 0–40)

## 2021-06-08 LAB — ECHOCARDIOGRAM COMPLETE
AR max vel: 2.89 cm2
AV Area VTI: 2.66 cm2
AV Area mean vel: 2.66 cm2
AV Mean grad: 2 mmHg
AV Peak grad: 4.3 mmHg
Ao pk vel: 1.04 m/s
Area-P 1/2: 3.42 cm2
Calc EF: 75.8 %
Height: 67 in
MV VTI: 3.58 cm2
S' Lateral: 2.7 cm
Single Plane A2C EF: 77.9 %
Single Plane A4C EF: 70.4 %
Weight: 2304 oz

## 2021-06-08 MED ORDER — CLOPIDOGREL BISULFATE 75 MG PO TABS: 75.0000 mg | ORAL_TABLET | Freq: Every day | ORAL | 1 refills | Status: AC

## 2021-06-08 MED ORDER — ROSUVASTATIN CALCIUM 20 MG PO TABS
40.0000 mg | ORAL_TABLET | Freq: Every day | ORAL | Status: DC
  Administered 2021-06-08: 40 mg via ORAL
  Filled 2021-06-08: qty 2

## 2021-06-08 MED ORDER — ROSUVASTATIN CALCIUM 40 MG PO TABS: 40.0000 mg | ORAL_TABLET | Freq: Every day | ORAL | 1 refills | Status: DC

## 2021-06-08 MED ORDER — CLOPIDOGREL BISULFATE 75 MG PO TABS
75.0000 mg | ORAL_TABLET | Freq: Every day | ORAL | Status: DC
  Administered 2021-06-08: 75 mg via ORAL
  Filled 2021-06-08: qty 1

## 2021-06-08 MED ORDER — ASPIRIN 81 MG PO TBEC
81.0000 mg | DELAYED_RELEASE_TABLET | Freq: Every day | ORAL | Status: DC
  Filled 2021-06-08: qty 1

## 2021-06-08 NOTE — Evaluation (Signed)
Physical Therapy Evaluation and Discharge Patient Details  Name: Erik Holder MRN: [id number] DOB: [birth year] Today's Date: 06/08/2021  History of Present Illness  76 y.o. male  who presented to ED 06/07/21 with complaints of bilateral leg weakness x 7 days. MRI brain: acute perforator infarct at the left corona radiata. PMH  significant of T2DM, HTN, HLD, chron's disease on remicade q 2 months, hx of CVA  Clinical Impression   Patient evaluated by Physical Therapy with no further acute PT needs identified. Patient will benefit from OPPT to return to full independence. All education has been completed and the patient has no further questions. Wife present as well.  See below for any follow-up Physical Therapy or equipment needs. PT is signing off. Thank you for this referral.        Recommendations for follow up therapy are one component of a multi-disciplinary discharge planning process, led by the attending physician.  Recommendations may be updated based on patient status, additional functional criteria and insurance authorization.  Follow Up Recommendations Outpatient PT    Assistance Recommended at Discharge PRN  Patient can return home with the following  Help with stairs or ramp for entrance    Equipment Recommendations Rolling walker (2 wheels)  Recommendations for Other Services       Functional Status Assessment Patient has had a recent decline in their functional status and demonstrates the ability to make significant improvements in function in a reasonable and predictable amount of time.     Precautions / Restrictions Precautions Precautions: Fall      Mobility  Bed Mobility Overal bed mobility: Independent                  Transfers Overall transfer level: Needs assistance Equipment used: Rolling walker (2 wheels), None Transfers: Sit to/from Stand Sit to Stand: Supervision           General transfer comment: vc for use of RW; able to  perform wihtout RW    Ambulation/Gait Ambulation/Gait assistance: Min guard Gait Distance (Feet): 200 Feet Assistive device: Rolling walker (2 wheels), None Gait Pattern/deviations: Step-through pattern, Decreased dorsiflexion - right, Steppage Gait velocity: decr Gait velocity interpretation: 1.31 - 2.62 ft/sec, indicative of limited community ambulator   General Gait Details: without RW, very guarded with shuffling steps; with RW initially with steppage gait but progressed to heelstrike with cues  Stairs Stairs: Yes   Stair Management: Forwards Number of Stairs: 1 General stair comments: demonstrated/simulated in room wiht wife present and both able to verbalize  Wheelchair Mobility    Modified Rankin (Stroke Patients Only) Modified Rankin (Stroke Patients Only) Pre-Morbid Rankin Score: No symptoms Modified Rankin: Moderately severe disability     Balance                                             Pertinent Vitals/Pain Pain Assessment Pain Assessment: No/denies pain    Home Living Family/patient expects to be discharged to:: Private residence Living Arrangements: Spouse/significant other;Children Available Help at Discharge: Family;Available 24 hours/day Type of Home: House Home Access: Stairs to enter Entrance Stairs-Rails: None Entrance Stairs-Number of Steps: 1   Home Layout: Two level;Able to live on main level with bedroom/bathroom Home Equipment: Kasandra Knudsen - single point      Prior Function Prior Level of Function : Independent/Modified Independent  Mobility Comments: walks several miles per day       Hand Dominance        Extremity/Trunk Assessment   Upper Extremity Assessment Upper Extremity Assessment: Overall WFL for tasks assessed    Lower Extremity Assessment Lower Extremity Assessment: RLE deficits/detail RLE Deficits / Details: knee extension 4/5, knee flexion 3+, ankle DF 3+ RLE Sensation: decreased  light touch RLE Coordination: decreased fine motor;decreased gross motor    Cervical / Trunk Assessment Cervical / Trunk Assessment: Normal  Communication   Communication: No difficulties  Cognition Arousal/Alertness: Awake/alert Behavior During Therapy: WFL for tasks assessed/performed Overall Cognitive Status: Within Functional Limits for tasks assessed                                          General Comments General comments (skin integrity, edema, etc.): Wife present    Exercises     Assessment/Plan    PT Assessment All further PT needs can be met in the next venue of care  PT Problem List Decreased strength;Decreased activity tolerance;Decreased balance;Decreased mobility;Decreased knowledge of use of DME;Impaired sensation       PT Treatment Interventions DME instruction;Gait training;Stair training;Functional mobility training;Therapeutic activities;Therapeutic exercise;Neuromuscular re-education;Patient/family education    PT Goals (Current goals can be found in the Care Plan section)  Acute Rehab PT Goals Patient Stated Goal: return to walking for exercise PT Goal Formulation: All assessment and education complete, DC therapy    Frequency       Co-evaluation               AM-PAC PT "6 Clicks" Mobility  Outcome Measure Help needed turning from your back to your side while in a flat bed without using bedrails?: None Help needed moving from lying on your back to sitting on the side of a flat bed without using bedrails?: None Help needed moving to and from a bed to a chair (including a wheelchair)?: None Help needed standing up from a chair using your arms (e.g., wheelchair or bedside chair)?: None Help needed to walk in hospital room?: None Help needed climbing 3-5 steps with a railing? : A Little 6 Click Score: 23    End of Session Equipment Utilized During Treatment: Gait belt Activity Tolerance: Patient tolerated treatment  well Patient left: in bed;with call bell/phone within reach;with family/visitor present Nurse Communication: Mobility status (OK to be up to bathroom with RW modified independent) PT Visit Diagnosis: Muscle weakness (generalized) (M62.81);Hemiplegia and hemiparesis Hemiplegia - Right/Left: Right Hemiplegia - dominant/non-dominant: Dominant Hemiplegia - caused by: Cerebral infarction    Time: 1229-1250 PT Time Calculation (min) (ACUTE ONLY): 21 min   Charges:   PT Evaluation $PT Eval Low Complexity: [place], PT Acute Rehabilitation Services  Pager [phone] Office [phone]   Rexanne Mano 06/08/2021, 1:03 PM

## 2021-06-08 NOTE — Progress Notes (Signed)
Harriett Sine to be D/C'd home per MD order. Discussed with the patient and wife and all questions fully answered.  Skin clean, dry and intact without evidence of skin break down, no evidence of skin tears noted.  IV catheter discontinued intact. Site without signs and symptoms of complications. Dressing and pressure applied.  An After Visit Summary was printed and given to the patient. Walker delivered to room and sent with patient. Patient escorted via [place], and D/C home via private auto.  Melonie [place]  06/08/2021

## 2021-06-08 NOTE — Care Management CC44 (Signed)
Condition Code 44 Documentation Completed  Patient Details  Name: Erik Holder MRN: [id number] Date of Birth: July 16, 1945   Condition Code 44 given:  Yes Patient signature on Condition Code 44 notice:  Yes Documentation of 2 MD's agreement:  Yes Code 44 added to claim:  Yes    Lawerance Sabal, RN 06/08/2021, 1:50 PM

## 2021-06-08 NOTE — Care Management Obs Status (Signed)
MEDICARE OBSERVATION STATUS NOTIFICATION   Patient Details  Name: Erik Holder MRN: [id number] Date of Birth: 03-20-1945   Medicare Observation Status Notification Given:  Yes    Lawerance Sabal, RN 06/08/2021, 1:50 PM

## 2021-06-08 NOTE — TOC Transition Note (Signed)
Transition of Care [facility]) - CM/SW Discharge Note   Patient Details  Name: Erik Holder MRN: [id number] Date of Birth: [birth year]  [facility] CM/SW Contact:  Carles Collet, RN Phone Number: 06/08/2021, 1:54 PM   Clinical Narrative:    [name] w patient and wife at bedside. RW to be delivered to the room. Referral made to OP PT. Code 44 completed.  No other TOC needs identified         Patient Goals and CMS Choice        Discharge Placement                       Discharge Plan and Services                                     Social Determinants of Health (SDOH) Interventions     Readmission Risk Interventions     View : No data to display.

## 2021-06-08 NOTE — Progress Notes (Addendum)
STROKE TEAM PROGRESS NOTE   INTERVAL HISTORY Patient is seen in his room with his wife at the bedside.  He is a semi-retired nephrologist.  He reports that about a week ago, he noticed bilateral lower extremity weakness which he believed was due to his increased dose of atorvastatin.  However, weakness worsened with decreased ability to walk distances, and right arm and leg numbness with increased right leg weakness developed yesterday.  MRI shows acute infarct in the left corona radiata.  Vitals:   06/07/21 1945 06/07/21 2313 06/08/21 0340 06/08/21 0742  BP: 137/80 119/68 131/77 133/73  Pulse: 75 68 77 74  Resp:  Temp: 98.1 F (36.7 C) 97.6 F (36.4 C) 98 F (36.7 C) 97.6 F (36.4 C)  TempSrc: Oral Oral Oral Oral  SpO2: 100% 100% 100% 100%  Weight:      Height:       CBC:  Recent Labs  Lab 06/06/21 0813 06/07/21 0800  WBC 7.1 6.7  NEUTROABS 2.5 3.1  HGB 13.5 14.2  HCT 40.4 43.7  MCV 90 90.1  PLT 272 274   Basic Metabolic Panel:  Recent Labs  Lab 06/06/21 0813 06/07/21 0800  NA 139 135  K 4.1 3.5  CL 102 101  CO2 22 26  GLUCOSE 106* 173*  BUN 14 14  CREATININE 1.08 1.10  CALCIUM 9.5 9.5  MG  --  2.1   Lipid Panel:  Recent Labs  Lab 06/08/21 0152  CHOL 163  TRIG 79  HDL 49  CHOLHDL 3.3  VLDL 16  LDLCALC 98   HgbA1c:  Recent Labs  Lab 06/07/21 1412  HGBA1C 6.2*   Urine Drug Screen: No results for input(s): LABOPIA, COCAINSCRNUR, LABBENZ, AMPHETMU, THCU, LABBARB in the last 168 hours.  Alcohol Level No results for input(s): ETH in the last 168 hours.  IMAGING past 24 hours CT ANGIO HEAD NECK W WO CM  Result Date: 06/07/2021 CLINICAL DATA:  Neuro deficit, acute, stroke suspected EXAM: CT ANGIOGRAPHY HEAD AND NECK TECHNIQUE: Multidetector CT imaging of the head and neck was performed using the standard protocol during bolus administration of intravenous contrast. Multiplanar CT image reconstructions and MIPs were obtained to evaluate the  vascular anatomy. Carotid stenosis measurements (when applicable) are obtained utilizing NASCET criteria, using the distal internal carotid diameter as the denominator. RADIATION DOSE REDUCTION: This exam was performed according to the departmental dose-optimization program which includes automated exposure control, adjustment of the mA and/or kV according to patient size and/or use of iterative reconstruction technique. CONTRAST:  75mL OMNIPAQUE IOHEXOL 350 MG/ML SOLN 75 mL of Omnipaque 350 IV. COMPARISON:  CT head May 27, 23. FINDINGS: CTA NECK FINDINGS Aortic arch: Great vessel origins are patent without significant stenosis. Right carotid system: Atherosclerosis at the carotid bifurcation without greater than 50% stenosis relative to the distal vessel. Left carotid system: Atherosclerosis at the carotid bifurcation with approximately 50% stenosis of the ICA origin. Vertebral arteries: Right dominant vertebral artery. Moderate stenosis of the right vertebral artery origin due to atherosclerosis. Otherwise, vertebral arteries are patent without significant stenosis. Skeleton: ACDF spanning C5-C7 with solid bony fusion. Other neck: Approximately 1.7 x 1.0 cm right-sided tracheal diverticulum at the level of the thyroid (series 6, image 75). Upper chest: Biapical pleuroparenchymal scarring. Otherwise, visualized lung apices are clear. Review of the MIP images confirms the above findings CTA HEAD FINDINGS Anterior circulation: Calcific atherosclerosis of bilateral intracranial ICAs with mild resulting narrowing. Bilateral MCAs and ACAs are patent  without proximal hemodynamically significant stenosis. Approximately 2 mm inferiorly directed outpouching arising from the proximal right cavernous ICA. Posterior circulation: Bilateral intradural vertebral arteries, basilar artery and both posterior cerebral arteries are patent. Moderate bilateral P2 PCA stenosis. Venous sinuses: As permitted by contrast timing, patent.  Anatomic variants: Detailed above. Review of the MIP images confirms the above findings IMPRESSION: 1. No emergent large vessel occlusion. 2. Moderate bilateral P2 PCA stenosis. 3. Bilateral carotid bifurcation atherosclerosis with approximately 50% stenosis of the left ICA origin. 4. Approximately 2 mm inferiorly directed outpouching arising from the proximal right cavernous ICA, compatible with aneurysm versus infundibulum with vessel not well seen. 5. Approximately 1.7 x 1.0 cm right-sided tracheal diverticulum. Electronically Signed   By: Feliberto Harts M.D.   On: 06/07/2021 13:25   CT Lumbar Spine Wo Contrast  Result Date: 06/07/2021 CLINICAL DATA:  Lower back pain.  Increased fracture risk. EXAM: CT LUMBAR SPINE WITHOUT CONTRAST TECHNIQUE: Multidetector CT imaging of the lumbar spine was performed without intravenous contrast administration. Multiplanar CT image reconstructions were also generated. RADIATION DOSE REDUCTION: This exam was performed according to the departmental dose-optimization program which includes automated exposure control, adjustment of the mA and/or kV according to patient size and/or use of iterative reconstruction technique. COMPARISON:  MRI lumbar spine 07/02/2020 FINDINGS: Segmentation: 5 non-rib-bearing lumbar-type vertebral bodies. Alignment: No sagittal spondylolisthesis. No significant scoliotic curvature. Vertebrae: Vertebral body heights are maintained. Mild posterior L5-S1 disc space narrowing. Mild-to-moderate anterior L2-3 and L3-4 endplate osteophytes. Mild L3-4 and L5-S1 degenerative vacuum disc. 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 No acute fracture. Paraspinal and other soft tissues: Mild atherosclerotic calcifications within the abdominal aorta. Disc levels: T11-12: Right-greater-than-left facet joint hypertrophy results in mild right neuroforaminal stenosis. No central canal stenosis. T12-L1: No central canal or neuroforaminal  stenosis. L1-2: Mild bilateral facet joint hypertrophy. No central canal or neuroforaminal stenosis. L2-3: Mild-to-moderate bilateral facet joint hypertrophy. Minimal broad-based posterior disc osteophyte complex. Mild central canal stenosis. Moderate right and mild left neuroforaminal stenosis. L3-4: Mild-to-moderate bilateral facet joint hypertrophy. Mild broad-based posterior disc osteophyte complex. Mild bilateral neuroforaminal stenosis. Borderline mild central canal stenosis. L4-5: Mild-to-moderate bilateral facet joint hypertrophy. Mild broad-based posterior disc osteophyte complex. No significant neuroforaminal stenosis. Mild left-greater-than-right lateral recess narrowing and borderline mild central canal stenosis. L5-S1: Mild-to-moderate bilateral facet joint hypertrophy. Mild broad-based posterior disc osteophyte complex. Left intraforaminal disc and endplate spurring and moderate left neuroforaminal stenosis. No central canal stenosis. IMPRESSION: 1. Multilevel degenerative disc and joint changes as above. No acute fracture. 2. Mild L2-3 L3-4 and L4-5 central canal stenosis. 3. Multilevel neuroforaminal stenosis including moderate right and mild left L2-3, mild bilateral L3-4, and moderate left L5-S1 neuroforaminal stenosis. Electronically Signed   By: Neita Garnet M.D.   On: 06/07/2021 13:22   MR BRAIN WO CONTRAST  Result Date: 06/07/2021 CLINICAL DATA:  Neuro deficit with acute stroke suspected EXAM: MRI HEAD WITHOUT CONTRAST TECHNIQUE: Multiplanar, multiecho pulse sequences of the brain and surrounding structures were obtained without intravenous contrast. COMPARISON:  06/28/2013 FINDINGS: Brain: Wedge of restricted diffusion in the left corona radiata. Background of mild chronic small vessel ischemic gliosis for age. Chronic lacunar infarcts in the bilateral thalamus, right putamen, and left corona radiata. No acute hemorrhage, hydrocephalus, or masslike finding. Vascular: Major flow voids are  preserved Skull and upper cervical spine: Normal marrow signal Sinuses/Orbits: Right cataract resection IMPRESSION: 1. Acute perforator infarct at the left corona radiata. 2. Chronic small vessel ischemia with remote lacunar infarcts. Electronically Signed  By: Tiburcio Pea M.D.   On: 06/07/2021 11:24    PHYSICAL EXAM General:  Alert, well-developed, well-nourished elderly patient in no acute distress Respiratory: Regular, unlabored respirations on room air  NEURO:  Mental Status: AA&Ox3  Speech/Language: speech is without dysarthria or aphasia.  Fluency, and comprehension intact.  Cranial Nerves:  II: PERRL. Visual fields full.  III, IV, VI: EOMI. Eyelids elevate symmetrically.  V: Sensation is intact to light touch and symmetrical to face.  VII: Smile is symmetrical.   VIII: hearing intact to voice. IX, X: Phonation is normal.  XII: tongue is midline without fasciculations. Motor: 5/5 strength to BUE and LLE, 4+/5 to RLE Tone: is normal and bulk is normal Sensation- Intact to light touch bilaterally but diminished on the right  Coordination: FTN with mild ataxia on the right, HKS: mild right sided ataxia.No drift.  Gait- deferred   ASSESSMENT/PLAN Mr. Erik Holder is a 76 y.o. male with history of CM2, Crohn's disease, HTN and HLD presenting with symptoms that started about a week ago when he noticed bilateral lower extremity weakness.  He believed this was due to his increased dose of atorvastatin and reduced the dose.  He also attributed some of his symptoms to aftereffects of spinal surgery which was performed a few months ago for a L5-S1 herniated disc.  However, weakness worsened with decreased ability to walk distances, and right arm and leg numbness with increased right leg weakness developed yesterday.  MRI shows acute infarct in the left corona radiata.  Stroke:  left external capsule and corona radiata infarct likely secondary to small vessel disease source CT head  No acute abnormality. Small vessel disease. Atrophy. CTA head & neck No emergent LVO, moderate bilateral P2 stenosis, 50% stenosis of left ICA origin, 11mm outpouching from proximal right cavernous ICA, right sided tracheal diverticulum MRI  acute infarct in left corona radiata 2D Echo EF 60-65% LDL 98 HgbA1c 6.2 VTE prophylaxis - SCDs No antithrombotic prior to admission, now on clopidogrel 75 mg daily. No Aspirin due to history of Crohn's disease Therapy recommendations: outpt PT/OT Disposition:  likely home today  Hypertension Home meds:  losartan 100 mg daily Stable Long-term BP goal normotensive  Hyperlipidemia Home meds:  atorvastatin 40 mg daily LDL 98, goal < 70 changed lipitor to rosuvastatin 40 mg daily for better tolerance  Continue statin at discharge  Diabetes type II Controlled Home meds:  none HgbA1c 6.2, goal < 7.0 CBGs SSI PCP follow up  Other Stroke Risk Factors Advanced Age >/= 63  Former Cigarette smoker  Other Active Problems Crohn's disease, well-controlled Continue home mesalamine  Hospital day # 1  Cortney E Ernestina Columbia , MSN, AGACNP-BC [facility] See Amion for schedule and pager information 06/08/2021 10:48 AM  ATTENDING NOTE: I reviewed above note and agree with the assessment and plan. Pt was seen and examined.   76 year old male with history of hypertension, diabetes, hyperlipidemia, Crohn's disease admitted for bilateral lower extremity weakness for 1 week, right leg worsening weakness and numbness for 2 days.  CT no acute abnormality, bifrontal small old infarcts.  MRI left external capsule/CR small infarct.  CT head and neck bilateral ICA bulb and siphon atherosclerosis.  Bilateral P2 moderate stenosis.  Left ICA 50% stenosis.  EF 60 to 65%.  LDL 98, A1c 6.2.  Creatinine 1.10.  On exam, pt wife at the bedside, awake, alert, eyes open, orientated to age, place, time and people. No aphasia, fluent language, following all  simple  commands. Able to name and repeat and read. No gaze palsy, tracking bilaterally, visual field full, PERRL. No facial droop. Tongue midline. LUE and LLE 5/5, RUE 5-/5 and RLE 4+/5. Sensation decreased on the right, right FTN and HTS mild dysmetria probably still within the proportion of the weakness, gait not tested.   Etiology for patient stroke likely small vessel disease given risk factors and location.  Currently on Plavix 75 for stroke prevention.  No aspirin recommended given Crohn's disease.  Change from Lipitor 40 to Crestor 40 for better tolerance.  Aggressive risk factor modification.  PT and OT recommend outpatient PT/OT.  For detailed assessment and plan, please refer to above as I have made changes wherever appropriate.   Neurology will sign off. Please call with questions. Pt will follow up with stroke clinic NP at [facility] in about 4 weeks. Thanks for the consult.    Marvel Plan, MD PhD Stroke Neurology 06/08/2021 11:19 AM    To contact Stroke Continuity provider, please refer to [email or web address]. After hours, contact General Neurology

## 2021-06-08 NOTE — Plan of Care (Signed)
  Problem: Education: Goal: Knowledge of General Education information will improve Description Including pain rating scale, medication(s)/side effects and non-pharmacologic comfort measures Outcome: Progressing   

## 2021-06-08 NOTE — Discharge Summary (Signed)
Physician Discharge Summary   Patient: Erik Holder MRN: [id number] DOB: 1945-10-14  Admit date:     06/07/2021  Discharge date: 06/08/21  Discharge Physician: Tyrone Nine   PCP: Benetta Spar, MD   Recommendations at discharge:  Follow up with neurology ([facility]) for post-stroke hospitalization visit. Started plavix, augmented atorvastatin to rosuvastatin (monitor tolerance/lipids/LFTs)   Discharge Diagnoses: Principal Problem:   Acute CVA (cerebrovascular accident) (HCC) Active Problems:   Diabetes mellitus without complication (HCC)   Carotid artery stenosis, asymptomatic, left   Essential hypertension, benign   Mixed hyperlipidemia   Crohn's disease (HCC)   Herniation of nucleus pulposus, lumbar  Hospital Course: Erik Holder is a 76 y.o. male with medical history significant of T2DM, HTN, HLD, chron's disease on remicade q 2 months, hx of CVA who presented to ED with complaints of bilateral leg weakness x 7 days ago. Initially he thought it was due to his increase in lipitor. They then advised that he take this every other day. Two days ago he states his leg both felt odd and he thought he was going to fall. He changed shoes thinking that would help.  Yesterday he had right leg weakness that got progressively worse and was to the extent of dragging the ground and needing a cane. He also had some right arm weakness that felt heavy, but this seems to be doing better. He denies any focal deficits, speech deficits.     LKW: about one week ago      He has been feeling good. Denies any fever/chills, vision changes/headaches, chest pain or palpitations, shortness of breath or cough, abdominal pain, N/V/D, dysuria or leg swelling. He is quite fit. He walks 3-4 miles/day.      He does not smoke or drink alcohol. NO family history of CVA that he is aware of  Assessment and Plan: Acute left corona radiata CVA:  - Start plavix per discussion with neurology, follow up  with [facility] in 6-8 weeks - Ambulatory referral to PT - Augment statin (LDL 98, HDL 49) - Diabetes well-controlled off medications (had hypoglycemia with tradjenta, followed by endocrinology, Dr. Fransico Him).  - No CES on echo.   Crohn's disease: Quiescent, Dx 1999.  - Continue regular regimen with remicade and mesalamine. Follow up with Dr. Chesley Mires [facility].   Carotid artery stenosis: See CTA results below. Followed by vascular surgery at [facility].  T2DM: Well-controlled w/HbA1c 6.2%. - Continue regular exercise and dietary modifications.  Essential hypertension: Allowed permissive HTN while admitted, will restart home medication at discharge.    Mixed hyperlipidemia: Augment statin as above  Herniation of nucleus pulposus, lumbar: followed by [facility], Dr. Venetia Maxon, also hx cervical ACDF.  No red flags on exam and symptoms improving, likely related to stroke. CT lumbar ordered in ED and pending>no acute fractures. DDD.  Mild L2-L3, L3-L4 and L4-L5, central canal stenosis Multilevel neuroforaminal stenosis including moderate right and mild left L2-3, mild bilateral L3-4, and moderate left L5-S1 neuroforaminal stenosis.  Consultants: Neurology Procedures performed: None  Disposition: Home Diet recommendation:  Discharge Diet Orders (From admission, onward)     Start     Ordered   06/08/21 0000  Diet Carb Modified        06/08/21 1321   06/08/21 0000  Diet - low sodium heart healthy        06/08/21 1321           Cardiac and Carb modified diet DISCHARGE MEDICATION: Allergies as of 06/08/2021  Reactions   Lactose Intolerance (gi) Diarrhea   Metformin Diarrhea        Medication List     STOP taking these medications    atorvastatin 40 MG tablet Commonly known as: LIPITOR       TAKE these medications    cholecalciferol 25 MCG (1000 UNIT) tablet Commonly known as: VITAMIN D3 Take 1,000 Units by mouth daily.   clopidogrel 75 MG tablet Commonly known as:  PLAVIX Take 1 tablet (75 mg total) by mouth daily. Start taking on: Jun 09, 2021   FreeStyle Libre 2 Reader Hardie Pulley As directed   [employer] 2 Sensor Misc 1 Piece by Does not apply route every 14 (fourteen) days.   losartan 100 MG tablet Commonly known as: COZAAR Take 100 mg by mouth daily.   Mesalamine 800 MG Tbec Take 1,600 mg by mouth 2 (two) times daily.   rosuvastatin 40 MG tablet Commonly known as: CRESTOR Take 1 tablet (40 mg total) by mouth daily. Start taking on: Jun 09, 2021   vitamin B-12 1000 MCG tablet Commonly known as: CYANOCOBALAMIN Take 1,000 mcg by mouth daily.        Follow-up Information     Fanta, Wayland Salinas, MD. Schedule an appointment as soon as possible for a visit in 1 week(s).   Specialty: Internal Medicine Contact information: [address] [place] [address] [phone]         Micki Riley, MD. Schedule an appointment as soon as possible for a visit in 2 month(s).   Specialties: Neurology, Radiology Contact information: [address] [place] [address] [phone]                Discharge Exam: BP (!) 156/80 (BP Location: Right Arm)   Pulse 67   Temp 97.6 F (36.4 C) (Oral)   Resp 18   Ht  (1.702 m)   Wt 65.3 kg   SpO2 100%   BMI 22.55 kg/m   Well-appearing older male in no distress Clear and nonlabored on room air RRR without MRG. No edema Abd soft, +BS Mildly unsteady when rising to feet, though negative pronator drift and negative Rhomberg. Ataxic right heel-to-shin. No dysmetria otherwise. Slight abnormal latera RLE sensation. No focal weakness detected elsewhere.  Condition at discharge: stable  The results of significant diagnostics from this hospitalization (including imaging, microbiology, ancillary and laboratory) are listed below for reference.   Imaging Studies: CT ANGIO HEAD NECK W WO CM  Result Date: 06/07/2021 CLINICAL DATA:  Neuro deficit, acute,  stroke suspected EXAM: CT ANGIOGRAPHY HEAD AND NECK TECHNIQUE: Multidetector CT imaging of the head and neck was performed using the standard protocol during bolus administration of intravenous contrast. Multiplanar CT image reconstructions and MIPs were obtained to evaluate the vascular anatomy. Carotid stenosis measurements (when applicable) are obtained utilizing NASCET criteria, using the distal internal carotid diameter as the denominator. RADIATION DOSE REDUCTION: This exam was performed according to the departmental dose-optimization program which includes automated exposure control, adjustment of the mA and/or kV according to patient size and/or use of iterative reconstruction technique. CONTRAST:  75mL OMNIPAQUE IOHEXOL 350 MG/ML SOLN 75 mL of Omnipaque 350 IV. COMPARISON:  CT head May 27, 23. FINDINGS: CTA NECK FINDINGS Aortic arch: Great vessel origins are patent without significant stenosis. Right carotid system: Atherosclerosis at the carotid bifurcation without greater than 50% stenosis relative to the distal vessel. Left carotid system: Atherosclerosis at the carotid bifurcation with approximately 50% stenosis of  the ICA origin. Vertebral arteries: Right dominant vertebral artery. Moderate stenosis of the right vertebral artery origin due to atherosclerosis. Otherwise, vertebral arteries are patent without significant stenosis. Skeleton: ACDF spanning C5-C7 with solid bony fusion. Other neck: Approximately 1.7 x 1.0 cm right-sided tracheal diverticulum at the level of the thyroid (series 6, image 75). Upper chest: Biapical pleuroparenchymal scarring. Otherwise, visualized lung apices are clear. Review of the MIP images confirms the above findings CTA HEAD FINDINGS Anterior circulation: Calcific atherosclerosis of bilateral intracranial ICAs with mild resulting narrowing. Bilateral MCAs and ACAs are patent without proximal hemodynamically significant stenosis. Approximately 2 mm inferiorly directed  outpouching arising from the proximal right cavernous ICA. Posterior circulation: Bilateral intradural vertebral arteries, basilar artery and both posterior cerebral arteries are patent. Moderate bilateral P2 PCA stenosis. Venous sinuses: As permitted by contrast timing, patent. Anatomic variants: Detailed above. Review of the MIP images confirms the above findings IMPRESSION: 1. No emergent large vessel occlusion. 2. Moderate bilateral P2 PCA stenosis. 3. Bilateral carotid bifurcation atherosclerosis with approximately 50% stenosis of the left ICA origin. 4. Approximately 2 mm inferiorly directed outpouching arising from the proximal right cavernous ICA, compatible with aneurysm versus infundibulum with vessel not well seen. 5. Approximately 1.7 x 1.0 cm right-sided tracheal diverticulum. Electronically Signed   By: Feliberto Harts M.D.   On: 06/07/2021 13:25   CT HEAD WO CONTRAST  Result Date: 06/07/2021 CLINICAL DATA:  Neuro deficit.  Acute stroke suspected. EXAM: CT HEAD WITHOUT CONTRAST TECHNIQUE: Contiguous axial images were obtained from the base of the skull through the vertex without intravenous contrast. RADIATION DOSE REDUCTION: This exam was performed according to the departmental dose-optimization program which includes automated exposure control, adjustment of the mA and/or kV according to patient size and/or use of iterative reconstruction technique. COMPARISON:  None Available. FINDINGS: Brain: Mildly enlarged ventricles and cortical sulci. Mild patchy white matter low density in both cerebral hemispheres. Small area of more focal low-density in the left posterior frontal subcortical white matter and similar area in the right frontal subcortical white matter. No intracranial hemorrhage or mass lesion. Vascular: No hyperdense vessel or unexpected calcification. Skull: Normal. Negative for fracture or focal lesion. Sinuses/Orbits: Status post right cataract extraction. Unremarkable included  paranasal sinuses. Other: None. IMPRESSION: 1. Probable old, small lacunar infarct in the subcortical white matter of each frontal lobe. A subacute infarct at either location is less likely but not excluded. 2. Mild diffuse cerebral and cerebellar atrophy. 3. Mild chronic small-vessel white matter ischemic changes in both cerebral hemispheres. Electronically Signed   By: Beckie Salts M.D.   On: 06/07/2021 09:31   CT Lumbar Spine Wo Contrast  Result Date: 06/07/2021 CLINICAL DATA:  Lower back pain.  Increased fracture risk. EXAM: CT LUMBAR SPINE WITHOUT CONTRAST TECHNIQUE: Multidetector CT imaging of the lumbar spine was performed without intravenous contrast administration. Multiplanar CT image reconstructions were also generated. RADIATION DOSE REDUCTION: This exam was performed according to the departmental dose-optimization program which includes automated exposure control, adjustment of the mA and/or kV according to patient size and/or use of iterative reconstruction technique. COMPARISON:  MRI lumbar spine 07/02/2020 FINDINGS: Segmentation: 5 non-rib-bearing lumbar-type vertebral bodies. Alignment: No sagittal spondylolisthesis. No significant scoliotic curvature. Vertebrae: Vertebral body heights are maintained. Mild posterior L5-S1 disc space narrowing. Mild-to-moderate anterior L2-3 and L3-4 endplate osteophytes. Mild L3-4 and L5-S1 degenerative vacuum disc. 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 No acute fracture. Paraspinal and other soft tissues: Mild atherosclerotic calcifications within the abdominal aorta. Disc  levels: T11-12: Right-greater-than-left facet joint hypertrophy results in mild right neuroforaminal stenosis. No central canal stenosis. T12-L1: No central canal or neuroforaminal stenosis. L1-2: Mild bilateral facet joint hypertrophy. No central canal or neuroforaminal stenosis. L2-3: Mild-to-moderate bilateral facet joint hypertrophy. Minimal broad-based  posterior disc osteophyte complex. Mild central canal stenosis. Moderate right and mild left neuroforaminal stenosis. L3-4: Mild-to-moderate bilateral facet joint hypertrophy. Mild broad-based posterior disc osteophyte complex. Mild bilateral neuroforaminal stenosis. Borderline mild central canal stenosis. L4-5: Mild-to-moderate bilateral facet joint hypertrophy. Mild broad-based posterior disc osteophyte complex. No significant neuroforaminal stenosis. Mild left-greater-than-right lateral recess narrowing and borderline mild central canal stenosis. L5-S1: Mild-to-moderate bilateral facet joint hypertrophy. Mild broad-based posterior disc osteophyte complex. Left intraforaminal disc and endplate spurring and moderate left neuroforaminal stenosis. No central canal stenosis. IMPRESSION: 1. Multilevel degenerative disc and joint changes as above. No acute fracture. 2. Mild L2-3 L3-4 and L4-5 central canal stenosis. 3. Multilevel neuroforaminal stenosis including moderate right and mild left L2-3, mild bilateral L3-4, and moderate left L5-S1 neuroforaminal stenosis. Electronically Signed   By: Neita Garnet M.D.   On: 06/07/2021 13:22   MR BRAIN WO CONTRAST  Result Date: 06/07/2021 CLINICAL DATA:  Neuro deficit with acute stroke suspected EXAM: MRI HEAD WITHOUT CONTRAST TECHNIQUE: Multiplanar, multiecho pulse sequences of the brain and surrounding structures were obtained without intravenous contrast. COMPARISON:  06/28/2013 FINDINGS: Brain: Wedge of restricted diffusion in the left corona radiata. Background of mild chronic small vessel ischemic gliosis for age. Chronic lacunar infarcts in the bilateral thalamus, right putamen, and left corona radiata. No acute hemorrhage, hydrocephalus, or masslike finding. Vascular: Major flow voids are preserved Skull and upper cervical spine: Normal marrow signal Sinuses/Orbits: Right cataract resection IMPRESSION: 1. Acute perforator infarct at the left corona radiata. 2.  Chronic small vessel ischemia with remote lacunar infarcts. Electronically Signed   By: Tiburcio Pea M.D.   On: 06/07/2021 11:24   DG Chest Portable 1 View  Result Date: 06/07/2021 CLINICAL DATA:  CVA EXAM: PORTABLE CHEST 1 VIEW COMPARISON:  12/30/2016 FINDINGS: Artifact from EKG leads. Normal heart size and mediastinal contours. No acute infiltrate or edema. No effusion or pneumothorax. No acute osseous findings. IMPRESSION: No evidence of active disease. Electronically Signed   By: Tiburcio Pea M.D.   On: 06/07/2021 09:00   ECHOCARDIOGRAM COMPLETE  Result Date: 06/08/2021    ECHOCARDIOGRAM REPORT   Patient Name:   Erik Holder [facility] Date of Exam: 06/08/2021 Medical Rec #:  [id number]           Height:       67.0 in Accession #:    [id number]          Weight:       144.0 lb Date of Birth:  05/19/1945          BSA:          1.759 m Patient Age:    75 years            BP:           133/73 mmHg Patient Gender: M                   HR:           68 bpm. Exam Location:  Inpatient Procedure: 2D Echo, Cardiac Doppler, Color Doppler and Strain Analysis Indications:    CVA  History:        Patient has prior history of Echocardiogram examinations, most  recent 09/07/2011. Risk Factors:Diabetes and Hypertension.  Sonographer:    Neomia Dear RDCS Referring Phys: [id number] ALLISON WOLFE IMPRESSIONS  1. Left ventricular ejection fraction, by estimation, is 60 to 65%. The left ventricle has normal function. The left ventricle has no regional wall motion abnormalities. There is mild asymmetric left ventricular hypertrophy of the basal and septal segments. Left ventricular diastolic parameters were normal. The average left ventricular global longitudinal strain is -14.3 %. The global longitudinal strain is normal.  2. Right ventricular systolic function is normal. The right ventricular size is normal.  3. The mitral valve is abnormal. No evidence of mitral valve regurgitation. No evidence of mitral  stenosis.  4. The aortic valve is tricuspid. There is mild calcification of the aortic valve. There is mild thickening of the aortic valve. Aortic valve regurgitation is not visualized. Aortic valve sclerosis is present, with no evidence of aortic valve stenosis.  5. The inferior vena cava is normal in size with greater than 50% respiratory variability, suggesting right atrial pressure of 3 mmHg. FINDINGS  Left Ventricle: Left ventricular ejection fraction, by estimation, is 60 to 65%. The left ventricle has normal function. The left ventricle has no regional wall motion abnormalities. The average left ventricular global longitudinal strain is -14.3 %. The global longitudinal strain is normal. The left ventricular internal cavity size was normal in size. There is mild asymmetric left ventricular hypertrophy of the basal and septal segments. Left ventricular diastolic parameters were normal. Right Ventricle: The right ventricular size is normal. No increase in right ventricular wall thickness. Right ventricular systolic function is normal. Left Atrium: Left atrial size was normal in size. Right Atrium: Right atrial size was normal in size. Pericardium: There is no evidence of pericardial effusion. Mitral Valve: The mitral valve is abnormal. There is mild thickening of the mitral valve leaflet(s). There is mild calcification of the mitral valve leaflet(s). Mild mitral annular calcification. No evidence of mitral valve regurgitation. No evidence of mitral valve stenosis. MV peak gradient, 2.2 mmHg. The mean mitral valve gradient is 1.0 mmHg. Tricuspid Valve: The tricuspid valve is normal in structure. Tricuspid valve regurgitation is not demonstrated. No evidence of tricuspid stenosis. Aortic Valve: The aortic valve is tricuspid. There is mild calcification of the aortic valve. There is mild thickening of the aortic valve. Aortic valve regurgitation is not visualized. Aortic valve sclerosis is present, with no evidence  of aortic valve stenosis. Aortic valve mean gradient measures 2.0 mmHg. Aortic valve peak gradient measures 4.3 mmHg. Aortic valve area, by VTI measures 2.66 cm. Pulmonic Valve: The pulmonic valve was normal in structure. Pulmonic valve regurgitation is not visualized. No evidence of pulmonic stenosis. Aorta: The aortic root is normal in size and structure. Venous: The inferior vena cava is normal in size with greater than 50% respiratory variability, suggesting right atrial pressure of 3 mmHg. IAS/Shunts: No atrial level shunt detected by color flow Doppler.  LEFT VENTRICLE PLAX 2D LVIDd:         3.70 cm     Diastology LVIDs:         2.70 cm     LV e' medial:    5.93 cm/s LV PW:         1.20 cm     LV E/e' medial:  10.6 LV IVS:        1.20 cm     LV e' lateral:   7.43 cm/s LVOT diam:     2.10 cm     LV  E/e' lateral: 8.4 LV SV:         61 LV SV Index:   34          2D Longitudinal Strain LVOT Area:     3.46 cm    2D Strain GLS Avg:     -14.3 %  LV Volumes (MOD) LV vol d, MOD A2C: 58.8 ml LV vol d, MOD A4C: 55.1 ml LV vol s, MOD A2C: 13.0 ml LV vol s, MOD A4C: 16.3 ml LV SV MOD A2C:     45.8 ml LV SV MOD A4C:     55.1 ml LV SV MOD BP:      45.2 ml RIGHT VENTRICLE RV Basal diam:  2.60 cm RV Mid diam:    1.80 cm RV S prime:     8.93 cm/s TAPSE (M-mode): 2.3 cm LEFT ATRIUM             Index        RIGHT ATRIUM          Index LA diam:        3.10 cm 1.76 cm/m   RA Area:     7.90 cm LA Vol (A2C):   41.6 ml 23.65 ml/m  RA Volume:   11.50 ml 6.54 ml/m LA Vol (A4C):   35.5 ml 20.18 ml/m LA Biplane Vol: 39.9 ml 22.69 ml/m  AORTIC VALVE                    PULMONIC VALVE AV Area (Vmax):    2.89 cm     PV Vmax:       0.97 m/s AV Area (Vmean):   2.66 cm     PV Vmean:      66.950 cm/s AV Area (VTI):     2.66 cm     PV VTI:        0.231 m AV Vmax:           104.00 cm/s  PV Peak grad:  3.8 mmHg AV Vmean:          70.600 cm/s  PV Mean grad:  2.0 mmHg AV VTI:            0.228 m AV Peak Grad:      4.3 mmHg AV Mean Grad:       2.0 mmHg LVOT Vmax:         86.80 cm/s LVOT Vmean:        54.300 cm/s LVOT VTI:          0.175 m LVOT/AV VTI ratio: 0.77  AORTA Ao Root diam: 3.20 cm MITRAL VALVE MV Area (PHT): 3.42 cm    SHUNTS MV Area VTI:   3.58 cm    Systemic VTI:  0.18 m MV Peak grad:  2.2 mmHg    Systemic Diam: 2.10 cm MV Mean grad:  1.0 mmHg MV Vmax:       0.74 m/s MV Vmean:      47.1 cm/s MV Decel Time: 222 msec MV E velocity: 62.60 cm/s MV A velocity: 59.20 cm/s MV E/A ratio:  1.06 Charlton Haws MD Electronically signed by Charlton Haws MD Signature Date/Time: 06/08/2021/12:01:24 PM    Final     Microbiology: No results found for this or any previous visit.  Labs: CBC: Recent Labs  Lab 06/06/21 0813 06/07/21 0800  WBC 7.1 6.7  NEUTROABS 2.5 3.1  HGB 13.5 14.2  HCT 40.4 43.7  MCV 90 90.1  PLT 272 274  Basic Metabolic Panel: Recent Labs  Lab 06/06/21 0813 06/07/21 0800  NA 139 135  K 4.1 3.5  CL 102 101  CO2 22 26  GLUCOSE 106* 173*  BUN 14 14  CREATININE 1.08 1.10  CALCIUM 9.5 9.5  MG  --  2.1   Liver Function Tests: Recent Labs  Lab 06/06/21 0813 06/07/21 0800  AST 27 29  ALT 23 25  ALKPHOS 87 69  BILITOT 0.6 0.9  PROT 7.5 7.7  ALBUMIN 4.5 4.1   CBG: No results for input(s): GLUCAP in the last 168 hours.  Discharge time spent: greater than 30 minutes.  Signed: Tyrone Nine, MD [facility] 06/08/2021

## 2021-06-11 LAB — COMPREHENSIVE METABOLIC PANEL
ALT: 23 IU/L (ref 0–44)
AST: 27 IU/L (ref 0–40)
Albumin/Globulin Ratio: 1.5 (ref 1.2–2.2)
Albumin: 4.5 g/dL (ref 3.7–4.7)
Alkaline Phosphatase: 87 IU/L (ref 44–121)
BUN/Creatinine Ratio: 13 (ref 10–24)
BUN: 14 mg/dL (ref 8–27)
Bilirubin Total: 0.6 mg/dL (ref 0.0–1.2)
CO2: 22 mmol/L (ref 20–29)
Calcium: 9.5 mg/dL (ref 8.6–10.2)
Chloride: 102 mmol/L (ref 96–106)
Creatinine, Ser: 1.08 mg/dL (ref 0.76–1.27)
Globulin, Total: 3 g/dL (ref 1.5–4.5)
Glucose: 106 mg/dL — ABNORMAL HIGH (ref 70–99)
Potassium: 4.1 mmol/L (ref 3.5–5.2)
Sodium: 139 mmol/L (ref 134–144)
Total Protein: 7.5 g/dL (ref 6.0–8.5)
eGFR: 72 mL/min/{1.73_m2} (ref >59)

## 2021-06-11 LAB — LIPID PANEL
Chol/HDL Ratio: 3.6 ratio (ref 0.0–5.0)
Cholesterol, Total: 169 mg/dL (ref 100–199)
HDL: 47 mg/dL (ref >39)
LDL Chol Calc (NIH): 105 mg/dL — ABNORMAL HIGH (ref 0–99)
Triglycerides: 91 mg/dL (ref 0–149)
VLDL Cholesterol Cal: 17 mg/dL (ref 5–40)

## 2021-06-11 LAB — CBC WITH DIFFERENTIAL/PLATELET
Basophils Absolute: 0 10*3/uL (ref 0.0–0.2)
Basos: 1 %
EOS (ABSOLUTE): 0 10*3/uL (ref 0.0–0.4)
Eos: 0 %
Hematocrit: 40.4 % (ref 37.5–51.0)
Hemoglobin: 13.5 g/dL (ref 13.0–17.7)
Immature Grans (Abs): 0 10*3/uL (ref 0.0–0.1)
Immature Granulocytes: 0 %
Lymphocytes Absolute: 4 10*3/uL — ABNORMAL HIGH (ref 0.7–3.1)
Lymphs: 57 %
MCH: 30 pg (ref 26.6–33.0)
MCHC: 33.4 g/dL (ref 31.5–35.7)
MCV: 90 fL (ref 79–97)
Monocytes Absolute: 0.5 10*3/uL (ref 0.1–0.9)
Monocytes: 7 %
Neutrophils Absolute: 2.5 10*3/uL (ref 1.4–7.0)
Neutrophils: 35 %
Platelets: 272 10*3/uL (ref 150–450)
RBC: 4.5 x10E6/uL (ref 4.14–5.80)
RDW: 14 % (ref 11.6–15.4)
WBC: 7.1 10*3/uL (ref 3.4–10.8)

## 2021-06-11 LAB — VITAMIN D 25 HYDROXY (VIT D DEFICIENCY, FRACTURES): Vit D, 25-Hydroxy: 42.1 ng/mL (ref 30.0–100.0)

## 2021-06-11 LAB — TESTOSTERONE, FREE, TOTAL, SHBG
Sex Hormone Binding: 107 nmol/L — ABNORMAL HIGH (ref 19.3–76.4)
Testosterone, Free: 4.9 pg/mL — ABNORMAL LOW (ref 6.6–18.1)
Testosterone: 785 ng/dL (ref 264–916)

## 2021-06-11 LAB — VITAMIN B12: Vitamin B-12: 820 pg/mL (ref 232–1245)

## 2021-06-11 LAB — TSH: TSH: 2.54 u[IU]/mL (ref 0.450–4.500)

## 2021-06-11 LAB — T4, FREE: Free T4: 1.33 ng/dL (ref 0.82–1.77)

## 2021-08-07 ENCOUNTER — Ambulatory Visit (INDEPENDENT_AMBULATORY_CARE_PROVIDER_SITE_OTHER): Payer: Medicare Other | Admitting: "Endocrinology

## 2021-08-07 ENCOUNTER — Encounter: Payer: Self-pay | Admitting: "Endocrinology

## 2021-08-07 VITALS — BP 120/78 | HR 76 | Wt 139.2 lb

## 2021-08-07 DIAGNOSIS — I639 Cerebral infarction, unspecified: Secondary | ICD-10-CM | POA: Diagnosis not present

## 2021-08-07 DIAGNOSIS — I10 Essential (primary) hypertension: Secondary | ICD-10-CM

## 2021-08-07 DIAGNOSIS — E1159 Type 2 diabetes mellitus with other circulatory complications: Secondary | ICD-10-CM | POA: Diagnosis not present

## 2021-08-07 DIAGNOSIS — E782 Mixed hyperlipidemia: Secondary | ICD-10-CM

## 2021-08-07 LAB — POCT GLYCOSYLATED HEMOGLOBIN (HGB A1C): HbA1c, POC (controlled diabetic range): 5.6 % (ref 0.0–7.0)

## 2021-08-07 NOTE — Progress Notes (Signed)
08/07/2021, 5:51 PM   Endocrinology follow-up note  Subjective:    Patient ID: Erik Holder, male    DOB: 1945/06/25.  Erik Holder is being seen in follow-up after he was seen in consultation for management of currently controlled asymptomatic diabetes, hypercholesterolemia requested by  Carrolyn Meiers, MD.   Past Medical History:  Diagnosis Date   Crohn's disease ([place])    DM (diabetes mellitus) ([place])    HTN (hypertension)    Macular cyst, hole, or pseudohole of retina    Neuropathy     Past Surgical History:  Procedure Laterality Date   CATARACT EXTRACTION     CERVICAL SPINE SURGERY      Social History   Socioeconomic History   Marital status: Married    Spouse name: Not on file   Number of children: Not on file   Years of education: Not on file   Highest education level: Not on file  Occupational History   Not on file  Tobacco Use   Smoking status: Former    Types: Cigarettes    Quit date: 08/20/1991    Years since quitting: 29.9   Smokeless tobacco: Never  Substance and Sexual Activity   Alcohol use: No   Drug use: No   Sexual activity: Not on file  Other Topics Concern   Not on file  Social History Narrative   Not on file   Social Determinants of Health   Financial Resource Strain: Not on file  Food Insecurity: Not on file  Transportation Needs: Not on file  Physical Activity: Not on file  Stress: Not on file  Social Connections: Not on file    Family History  Problem Relation Age of Onset   Cancer Sister     Outpatient Encounter Medications as of 08/07/2021  Medication Sig   cholecalciferol (VITAMIN D3) 25 MCG (1000 UNIT) tablet Take 1,000 Units by mouth daily.   clopidogrel (PLAVIX) 75 MG tablet Take 1 tablet (75 mg total) by mouth daily.   Continuous Blood Gluc Receiver (FREESTYLE LIBRE 2 READER) DEVI As directed   Continuous Blood Gluc  Sensor (FREESTYLE LIBRE 2 SENSOR) MISC 1 Piece by Does not apply route every 14 (fourteen) days.   losartan (COZAAR) 100 MG tablet Take 100 mg by mouth daily.   Mesalamine 800 MG TBEC Take 1,600 mg by mouth 2 (two) times daily.   rosuvastatin (CRESTOR) 10 MG tablet Take 10 mg by mouth at bedtime.   vitamin B-12 (CYANOCOBALAMIN) 1000 MCG tablet Take 1,000 mcg by mouth daily.   [DISCONTINUED] rosuvastatin (CRESTOR) 40 MG tablet Take 1 tablet (40 mg total) by mouth daily.   No facility-administered encounter medications on file as of 08/07/2021.    ALLERGIES: Allergies  Allergen Reactions   Lactose Intolerance (Gi) Diarrhea   Metformin Diarrhea    VACCINATION STATUS:  There is no immunization history on file for this patient.  Diabetes He presents for his follow-up diabetic visit. He has type 2 diabetes mellitus. Onset time: He was diagnosed at approximate age of 33 years. His disease course has been improving. There are no hypoglycemic associated symptoms. Pertinent negatives for  hypoglycemia include no confusion, headaches, pallor or seizures. There are no diabetic associated symptoms. Pertinent negatives for diabetes include no chest pain, no fatigue, no polydipsia, no polyphagia, no polyuria and no weakness. There are no hypoglycemic complications. Symptoms are improving. There are no diabetic complications. Risk factors for coronary artery disease include dyslipidemia, diabetes mellitus, hypertension and male sex. Current diabetic treatment includes oral agent (monotherapy) (He was on Tradjenta 5 mg p.o. daily until 10 days ago when he stopped it due to tightening glycemic profile.). His weight is stable. He is following a generally healthy diet. Meal planning includes avoidance of concentrated sweets. He has not had a previous visit with a dietitian. He participates in exercise daily. His home blood glucose trend is decreasing steadily. His breakfast blood glucose range is generally 130-140  mg/dl. His lunch blood glucose range is generally 130-140 mg/dl. His dinner blood glucose range is generally 130-140 mg/dl. His bedtime blood glucose range is generally 130-140 mg/dl. His overall blood glucose range is 130-140 mg/dl. (He wears a CGM device-Freestyle libre.  His AGP analysis shows average blood glucose of 130 for the last 14 days.  He has 92% time range, 8% slightly above range.  He has no hypoglycemia.  His recent A1c was 6+ percent.) An ACE inhibitor/angiotensin II receptor blocker is being taken.  Hyperlipidemia This is a chronic problem. Exacerbating diseases include diabetes. Pertinent negatives include no chest pain, myalgias or shortness of breath. Current antihyperlipidemic treatment includes statins. Risk factors for coronary artery disease include dyslipidemia, male sex and hypertension.  Hypertension This is a chronic problem. The problem is controlled. Pertinent negatives include no chest pain, headaches, neck pain, palpitations or shortness of breath. Risk factors for coronary artery disease include dyslipidemia and diabetes mellitus. Past treatments include lifestyle changes, angiotensin blockers and calcium channel blockers.     Review of Systems  Constitutional:  Negative for chills, fatigue, fever and unexpected weight change.  HENT:  Negative for dental problem, mouth sores and trouble swallowing.   Eyes:  Negative for visual disturbance.  Respiratory:  Negative for cough, choking, chest tightness, shortness of breath and wheezing.   Cardiovascular:  Negative for chest pain, palpitations and leg swelling.  Gastrointestinal:  Negative for abdominal distention, abdominal pain, constipation, diarrhea, nausea and vomiting.  Endocrine: Negative for polydipsia, polyphagia and polyuria.  Genitourinary:  Negative for dysuria, flank pain, hematuria and urgency.  Musculoskeletal:  Negative for back pain, gait problem, myalgias and neck pain.  Skin:  Negative for pallor, rash  and wound.  Neurological:  Negative for seizures, syncope, weakness, numbness and headaches.  Psychiatric/Behavioral:  Negative for confusion and dysphoric mood.     Objective:       08/07/2021    3:00 PM 06/08/2021   12:12 PM 06/08/2021    7:42 AM  Vitals with BMI  Weight 139 lbs 3 oz    Systolic 924 268 341  Diastolic 78 80 73  Pulse 76 67 74    BP 120/78   Pulse 76   Wt 139 lb 3.2 oz (63.1 kg)   BMI 21.80 kg/m   Wt Readings from Last 3 Encounters:  08/07/21 139 lb 3.2 oz (63.1 kg)  06/07/21 144 lb (65.3 kg)  06/06/21 147 lb 5 oz (66.8 kg)      Limited physical exam     CMP ( most recent) CMP     Component Value Date/Time   NA 135 06/07/2021 0800   NA 139 06/06/2021 0813   K  3.5 06/07/2021 0800   CL 101 06/07/2021 0800   CO2 26 06/07/2021 0800   GLUCOSE 173 (H) 06/07/2021 0800   BUN 14 06/07/2021 0800   BUN 14 06/06/2021 0813   CREATININE 1.10 06/07/2021 0800   CALCIUM 9.5 06/07/2021 0800   PROT 7.7 06/07/2021 0800   PROT 7.5 06/06/2021 0813   ALBUMIN 4.1 06/07/2021 0800   ALBUMIN 4.5 06/06/2021 0813   AST 29 06/07/2021 0800   ALT 25 06/07/2021 0800   ALKPHOS 69 06/07/2021 0800   BILITOT 0.9 06/07/2021 0800   BILITOT 0.6 06/06/2021 0813   GFRNONAA >60 06/07/2021 0800   GFRAA  10/16/2008 0851    >60        The eGFR has been calculated using the MDRD equation. This calculation has not been validated in all clinical situations. eGFR's persistently <60 mL/min signify possible Chronic Kidney Disease.       Lab Results  Component Value Date   TSH 1.380 06/07/2021   TSH 2.540 06/06/2021   TSH 2.050 06/19/2013   FREET4 1.33 06/06/2021       Latest Ref Rng & Units 06/07/2021    8:00 AM 06/06/2021    8:13 AM 07/06/2020    2:25 PM  CMP  Glucose 70 - 99 mg/dL 173  106  196   BUN 8 - 23 mg/dL _0 Creatinine 0.61 - 1.24 mg/dL 1.10  1.08  1.06   Sodium 135 - 145 mmol/L 135  139  135   Potassium 3.5 - 5.1 mmol/L 3.5  4.1  3.3   Chloride  98 - 111 mmol/L 101  102  100   CO2 22 - 32 mmol/L _1 Calcium 8.9 - 10.3 mg/dL 9.5  9.5  9.1   Total Protein 6.5 - 8.1 g/dL 7.7  7.5  7.1   Total Bilirubin 0.3 - 1.2 mg/dL 0.9  0.6  0.8   Alkaline Phos 38 - 126 U/L 69  87  80   AST 15 - 41 U/L _2 ALT 0 - 44 U/L _3 Assessment & Plan:   1. Diabetes mellitus with carotid artery stenosis   - Gevena Cotton Batton has currently uncontrolled symptomatic type 2 DM since  76 years of age. He wears a CGM device-Dexcom device showing 94% within range , 6% slightly above target.  His average glucose is 103.  His point-of-care A1c is 5.6%, improving from 6.2%.  No hypoglycemia.  He is not on medications currently.    Available labs from June 2022 were reviewed, normal renal function.    - I had a long discussion with him about the progressive nature of diabetes and the pathology behind its complications. -He recently developed CVA as a result of carotid artery stenosis,   and  he remains at a high risk for more acute and chronic complications which include CAD, CVA, CKD, retinopathy, and neuropathy. These are all discussed in detail with him.  - I discussed all available options of managing his diabetes including de-escalation of medications. I have counseled him on diet  and weight management  by adopting a Whole Food , Plant Predominant  ( WFPP) nutrition as recommended by [employer] of Lifestyle Medicine. Patient is encouraged to switch to  unprocessed or minimally processed  complex starch, adequate protein intake (mainly plant source), minimal liquid fat ( mainly vegetable  oils), plenty of fruits, and vegetables. -  he is advised to stick to a routine mealtimes to eat 3 complete meals a day and snack only when necessary ( to snack only to correct hypoglycemia BG <70 day time or <100 at night).   - he acknowledges that there is a room for improvement in his food and drink choices. - Further Specific Suggestion  is made for him to avoid simple carbohydrates  from his diet including Cakes, Sweet Desserts, Ice Cream, Soda (diet and regular), Sweet Tea, Candies, Chips, Cookies, Store Bought Juices, Alcohol ,  Artificial Sweeteners,  Coffee Creamer, and "Sugar-free" Products. This will help patient to have more stable blood glucose profile and potentially avoid unintended weight gain.  The following Lifestyle Medicine recommendations according to [facility]) were discussed and offered to patient and he agrees to start the journey:  A. Whole Foods, Plant-based plate comprising of fruits and vegetables, plant-based proteins, whole-grain carbohydrates was discussed in detail with the patient.   A list for source of those nutrients were also provided to the patient.  Patient will use only water or unsweetened tea for hydration. B.  The need to stay away from risky substances including alcohol, smoking; obtaining 7 to 9 hours of restorative sleep, at least 150 minutes of moderate intensity exercise weekly, the importance of healthy social connections,  and stress reduction techniques were discussed. C.  A full color page of  Calorie density of various food groups per pound showing examples of each food groups was provided to the patient.  - I have approached him with the following plan to manage  his diabetes and patient agrees:   -In light of his presentation with tightly controlled glycemic profile with no medications, he will be continued off of medications.  He has not taken diabetes medications for almost 3 months now .  This satisfies a description for type 2 diabetes remission.    -He is advised to continue to wear his CGM continuously, report blood glucose readings below 70 or greater than 200 mg per DL.   -He has several options to treat diabetes if necessary.  - Specific targets for  A1c;  LDL, HDL,  and Triglycerides were discussed with the patient.  2) Blood Pressure  /Hypertension:  his blood pressure is  controlled to target.   he is advised to continue his current medications including losartan 100 mg p.o. daily. His amlodipine was discontinued last visit. With recent switch to whole food, plant-based/predominant diet will help with diabetes, hypertension, hyperlipidemia.   3) Lipids/Hyperlipidemia: His recent lipid panel showed LDL 98.  He did not tolerate large dose of statins, currently on atorvastatin 10 mg p.o. nightly.  He is advised to continue until next fasting blood lipid measurement.    4)  Weight/Diet:  Body mass index is 21.8 kg/m.  -  he is not a candidate for weight loss. The above detailed  ACLM recommendations for nutrition, exercise, sleep, social life, avoidance of risky substances, the need for restorative sleep   information will also detailed on discharge instructions.  He is encouraged to stay active by daily moderate exercise.  5) Chronic Care/Health Maintenance:  -he  is on ACEI/ARB and Statin medications and  is encouraged to initiate and continue to follow up with Ophthalmology, Dentist,  Podiatrist at least yearly or according to recommendations, and advised to   stay away from smoking. I have recommended yearly flu vaccine and pneumonia vaccine at least  every 5 years; moderate intensity exercise for up to 150 minutes weekly; and  sleep for 7- 9 hours a day.  - he is  advised to maintain close follow up with Carrolyn Meiers, MD for primary care needs, as well as his other providers for optimal and coordinated care.   I spent 32 minutes in the care of the patient today including review of labs from [place], Lipids, Thyroid Function, Hematology (current and previous including abstractions from other facilities); face-to-face time discussing  his blood glucose readings/logs, discussing hypoglycemia and hyperglycemia episodes and symptoms, medications doses, his options of short and long term treatment based on the latest standards of  care / guidelines;  discussion about incorporating lifestyle medicine;  and documenting the encounter. Risk reduction counseling performed per USPSTF guidelines to reduce obesity and cardiovascular risk factors.     Please refer to Patient Instructions for Blood Glucose Monitoring and Insulin/Medications Dosing Guide"  in media tab for additional information. Please  also refer to " Patient Self Inventory" in the Media  tab for reviewed elements of pertinent patient history.  Erik Holder participated in the discussions, expressed understanding, and voiced agreement with the above plans.  All questions were answered to his satisfaction. he is encouraged to contact clinic should he have any questions or concerns prior to his return visit.    Follow up plan: - Return in about 3 months (around 11/07/2021) for F/U with Pre-visit Labs, Meter/CGM/Logs, A1c here.  Glade Lloyd, MD [facility] [address] [place], [place] [address] Phone: [phone]  Fax: [phone]    08/07/2021, 5:51 PM  This note was partially dictated with voice recognition software. Similar sounding words can be transcribed inadequately or may not  be corrected upon review.

## 2021-11-10 ENCOUNTER — Ambulatory Visit: Payer: BLUE CROSS/BLUE SHIELD | Admitting: "Endocrinology

## 2021-11-17 ENCOUNTER — Ambulatory Visit: Payer: BLUE CROSS/BLUE SHIELD | Admitting: "Endocrinology

## 2021-11-26 ENCOUNTER — Ambulatory Visit: Payer: BLUE CROSS/BLUE SHIELD | Admitting: "Endocrinology

## 2022-01-01 ENCOUNTER — Emergency Department (HOSPITAL_COMMUNITY): Payer: Medicare Other

## 2022-01-01 ENCOUNTER — Encounter (HOSPITAL_COMMUNITY): Payer: Self-pay | Admitting: Emergency Medicine

## 2022-01-01 ENCOUNTER — Other Ambulatory Visit: Payer: Self-pay

## 2022-01-01 ENCOUNTER — Emergency Department (HOSPITAL_COMMUNITY)
Admission: EM | Admit: 2022-01-01 | Discharge: 2022-01-01 | Disposition: A | Discharge status: Home / Self Care | Payer: Medicare Other | Attending: Emergency Medicine | Admitting: Emergency Medicine

## 2022-01-01 DIAGNOSIS — Z79899 Other long term (current) drug therapy: Secondary | ICD-10-CM | POA: Diagnosis not present

## 2022-01-01 DIAGNOSIS — E119 Type 2 diabetes mellitus without complications: Secondary | ICD-10-CM | POA: Insufficient documentation

## 2022-01-01 DIAGNOSIS — I10 Essential (primary) hypertension: Secondary | ICD-10-CM | POA: Insufficient documentation

## 2022-01-01 DIAGNOSIS — K5900 Constipation, unspecified: Secondary | ICD-10-CM | POA: Diagnosis present

## 2022-01-01 DIAGNOSIS — Z8673 Personal history of transient ischemic attack (TIA), and cerebral infarction without residual deficits: Secondary | ICD-10-CM | POA: Insufficient documentation

## 2022-01-01 DIAGNOSIS — Z7902 Long term (current) use of antithrombotics/antiplatelets: Secondary | ICD-10-CM | POA: Diagnosis not present

## 2022-01-01 LAB — CBG MONITORING, ED: Glucose-Capillary: 132 mg/dL — ABNORMAL HIGH (ref 70–99)

## 2022-01-01 MED ORDER — SENNOSIDES-DOCUSATE SODIUM 8.6-50 MG PO TABS: 1.0000 | ORAL_TABLET | Freq: Every evening | ORAL | 0 refills | Status: AC | PRN

## 2022-01-01 NOTE — ED Provider Notes (Signed)
[facility] EMERGENCY DEPARTMENT Provider Note   CSN: [id number] Arrival date & time: 01/01/22  1601     History  Chief Complaint  Patient presents with   Constipation    Erik Holder is a 76 y.o. male.  Pt is a 76 yo male with a pmhx significant for Crohn's disease, dm, htn, and CVA.  Pt is a nephrologist.  He has been constipated for 3 days and has a lot of discomfort.  He he has tried miralax and dulcolax without any improvement.  He has seen a little bit of blood with attempts to have a bowel movement.  He is concerned he's developing a fissure as he's had one in the past.  He has pain in his rectum.       Home Medications Prior to Admission medications   Medication Sig Start Date End Date Taking? Authorizing Provider  senna-docusate (SENOKOT-S) 8.6-50 MG tablet Take 1 tablet by mouth at bedtime as needed for mild constipation. 01/01/22  Yes Jacalyn Lefevre, MD  cholecalciferol (VITAMIN D3) 25 MCG (1000 UNIT) tablet Take 1,000 Units by mouth daily.    [provider]  clopidogrel (PLAVIX) 75 MG tablet Take 1 tablet (75 mg total) by mouth daily. 06/09/21   Tyrone Nine, MD  Continuous Blood Gluc Receiver (FREESTYLE LIBRE 2 READER) DEVI As directed 05/08/21   Roma Kayser, MD  Continuous Blood Gluc Sensor (FREESTYLE LIBRE 2 SENSOR) MISC 1 Piece by Does not apply route every 14 (fourteen) days. 05/08/21   Roma Kayser, MD  losartan (COZAAR) 100 MG tablet Take 100 mg by mouth daily.    [provider]  Mesalamine 800 MG TBEC Take 1,600 mg by mouth 2 (two) times daily. 05/03/20   [provider]  rosuvastatin (CRESTOR) 10 MG tablet Take 10 mg by mouth at bedtime. 08/06/21   [provider]  vitamin B-12 (CYANOCOBALAMIN) 1000 MCG tablet Take 1,000 mcg by mouth daily.    [provider]      Allergies    Lactose intolerance (gi) and Metformin    Review of Systems   Review of Systems  Gastrointestinal:  Positive  for constipation.  All other systems reviewed and are negative.   Physical Exam Updated Vital Signs BP (!) 176/97   Pulse (!) 106   Temp 97.9 F (36.6 C) (Oral)   Resp 18   SpO2 99%  Physical Exam Vitals and nursing note reviewed.  Constitutional:      Appearance: Normal appearance.  HENT:     Head: Normocephalic and atraumatic.     Right Ear: External ear normal.     Left Ear: External ear normal.     Nose: Nose normal.     Mouth/Throat:     Mouth: Mucous membranes are moist.     Pharynx: Oropharynx is clear.  Eyes:     Extraocular Movements: Extraocular movements intact.     Conjunctiva/sclera: Conjunctivae normal.     Pupils: Pupils are equal, round, and reactive to light.  Cardiovascular:     Rate and Rhythm: Normal rate and regular rhythm.     Pulses: Normal pulses.     Heart sounds: Normal heart sounds.  Pulmonary:     Effort: Pulmonary effort is normal.     Breath sounds: Normal breath sounds.  Abdominal:     General: Abdomen is flat. Bowel sounds are normal.     Palpations: Abdomen is soft.  Genitourinary:    Rectum: Normal.  Musculoskeletal:  General: Normal range of motion.     Cervical back: Normal range of motion and neck supple.  Skin:    General: Skin is warm.     Capillary Refill: Capillary refill takes less than 2 seconds.  Neurological:     General: No focal deficit present.     Mental Status: He is alert and oriented to person, place, and time.  Psychiatric:        Mood and Affect: Mood normal.        Behavior: Behavior normal.     ED Results / Procedures / Treatments   Labs (all labs ordered are listed, but only abnormal results are displayed) Labs Reviewed  CBG MONITORING, ED - Abnormal; Notable for the following components:      Result Value   Glucose-Capillary 132 (*)    All other components within normal limits    EKG None  Radiology No results found.  Procedures Procedures    Medications Ordered in  ED Medications - No data to display  ED Course/ Medical Decision Making/ A&P                           Medical Decision Making Amount and/or Complexity of Data Reviewed Radiology: ordered.   Pt was able to have a bowel movement and feels much better.  He is ready to go home.  Pt is stable for d/c.  Return if worse. F/u with pcp.        Final Clinical Impression(s) / ED Diagnoses Final diagnoses:  Constipation, unspecified constipation type    Rx / DC Orders ED Discharge Orders          Ordered    senna-docusate (SENOKOT-S) 8.6-50 MG tablet  At bedtime PRN        01/01/22 1744              Jacalyn Lefevre, MD 01/01/22 1752

## 2022-01-01 NOTE — ED Triage Notes (Addendum)
Pt states he has Crohns disease and has not had bm in 3 days with meds. Stats he has rectal fistula. States usually goes every day. Pain to rectum. A/o. Nad in triage. Denies n/v

## 2022-01-01 NOTE — ED Notes (Addendum)
Pt had a BM and feels relief now; MD aware

## 2023-08-29 IMAGING — CT CT ANGIO HEAD-NECK (W OR W/O PERF)
1 of 8 series · 14 of 47 positions shown · non-contrast
Comparison: CT head June 07, 21.

CLINICAL DATA: Neuro deficit, acute, stroke suspected

EXAM:
CT ANGIOGRAPHY HEAD AND NECK
TECHNIQUE: Multidetector CT imaging of the head and neck was performed using
the standard protocol during bolus administration of intravenous
contrast. Multiplanar CT image reconstructions and MIPs were
obtained to evaluate the vascular anatomy. Carotid stenosis
measurements (when applicable) are obtained utilizing NASCET
criteria, using the distal internal carotid diameter as the
denominator.

[Series 12: thin · axial · 0.49mm/px · z∈[-305,+12]mm · 14 of 733 slices shown]
[im 49/733  brain]
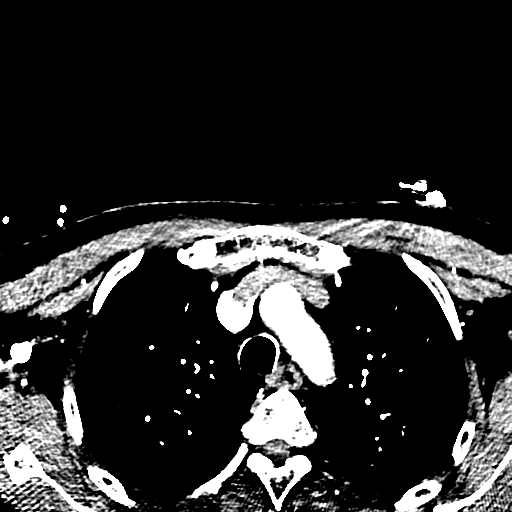
[im 98/733  bone]
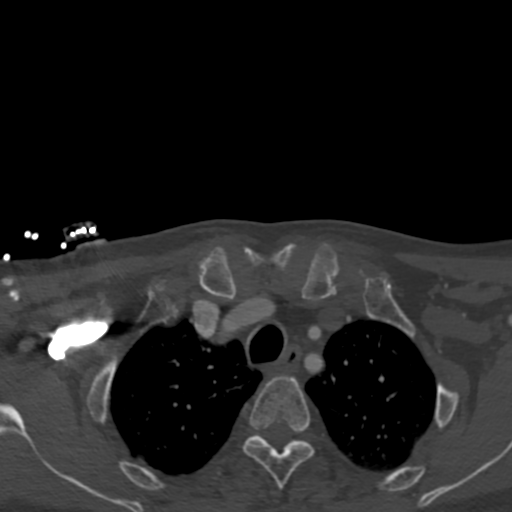
[im 147/733  brain]
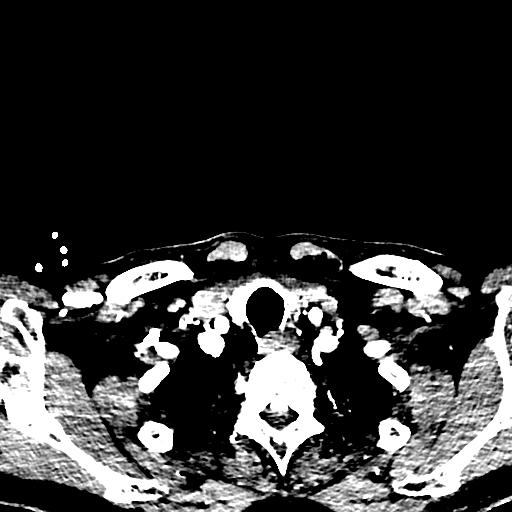
[im 196/733  bone]
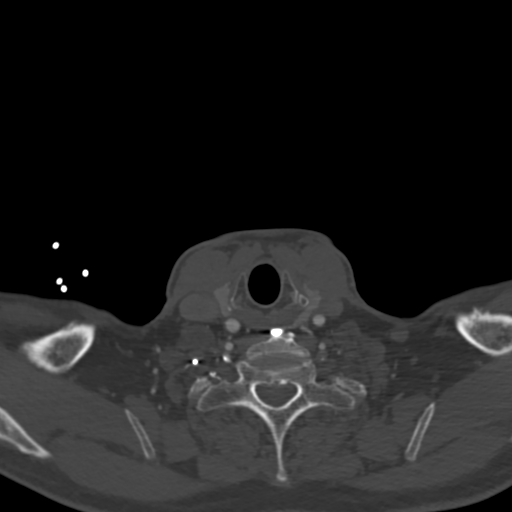
[im 245/733  brain]
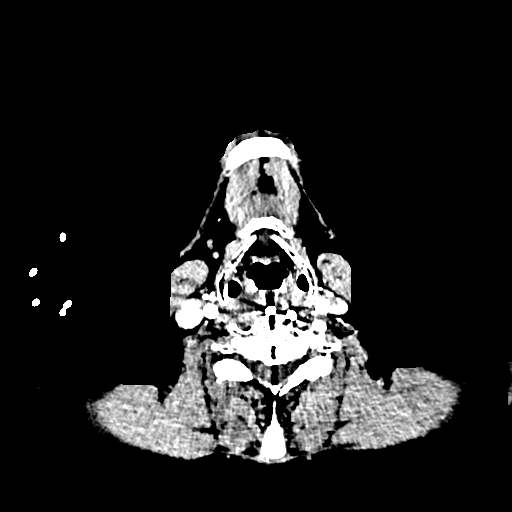
[im 293/733  bone]
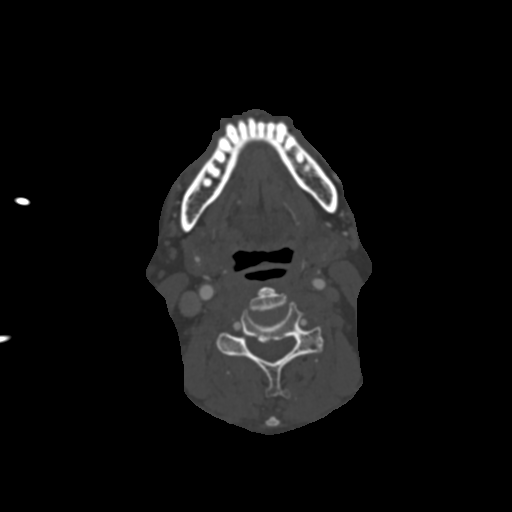
[im 342/733  brain]
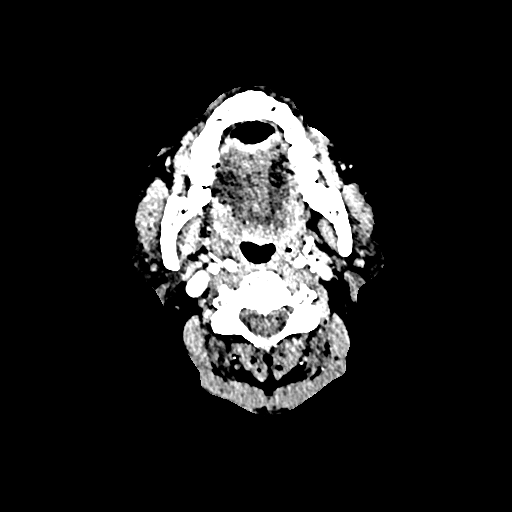
[im 391/733  bone]
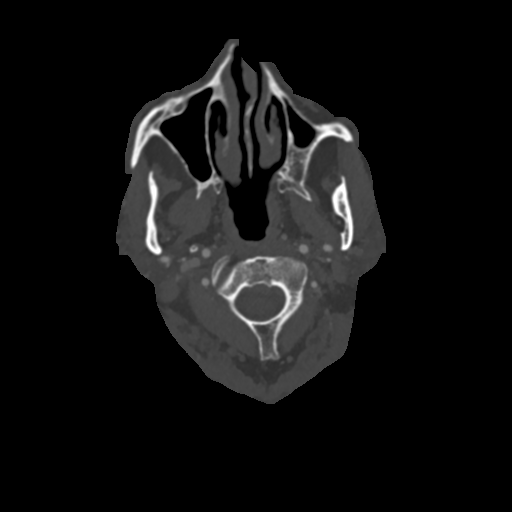
[im 440/733  brain]
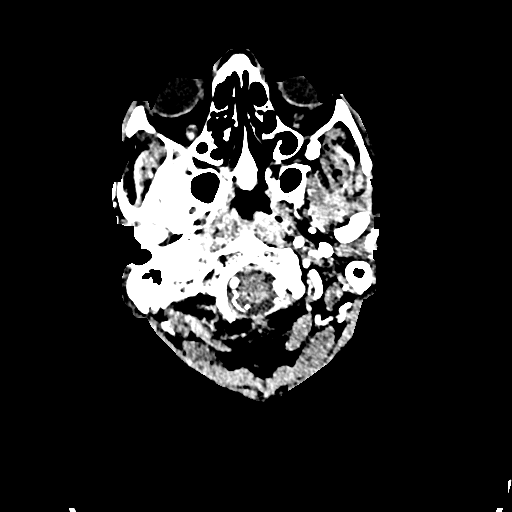
[im 489/733  bone]
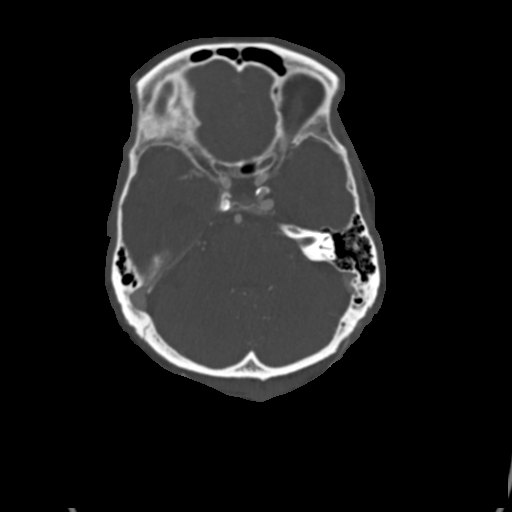
[im 537/733  brain]
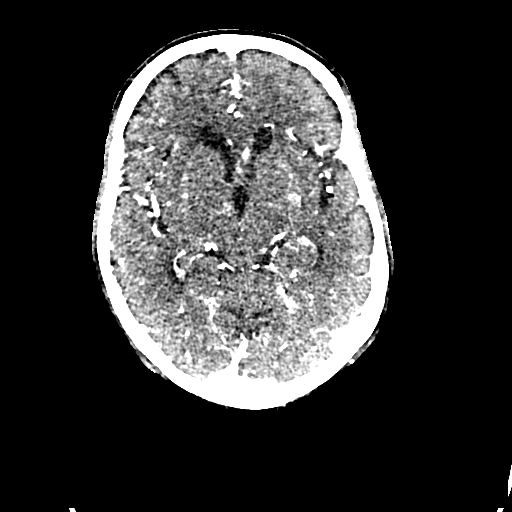
[im 586/733  bone]
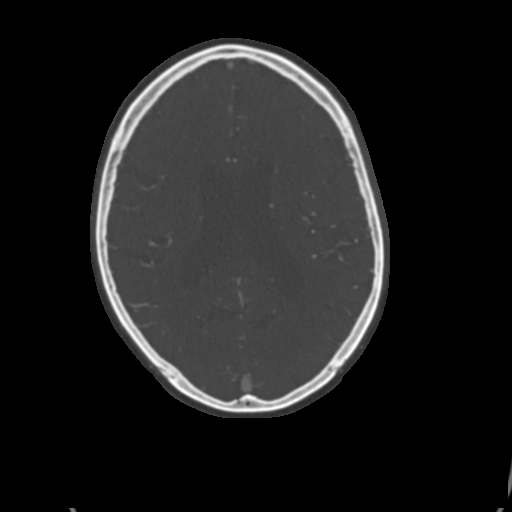
[im 635/733  brain]
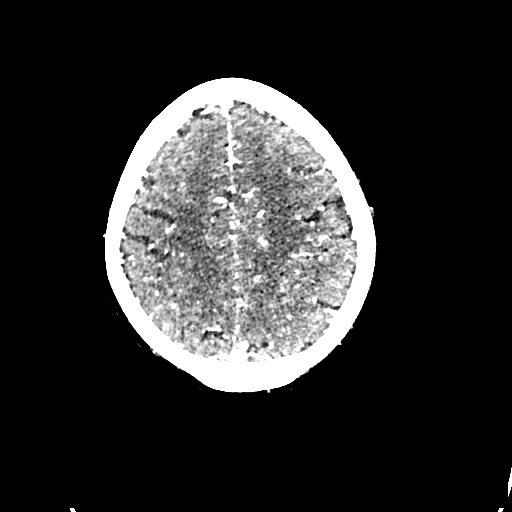
[im 684/733  bone]
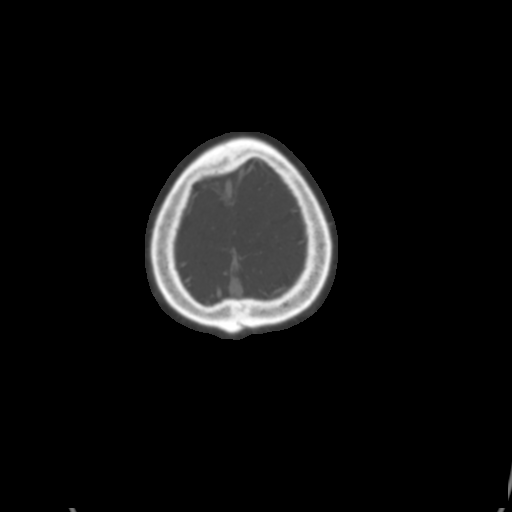

[14 of 47 positions shown; findings below may reference images not displayed]

RADIATION DOSE REDUCTION: This exam was performed according to the
departmental dose-optimization program which includes automated
exposure control, adjustment of the mA and/or kV according to
patient size and/or use of iterative reconstruction technique.

CONTRAST:  75mL OMNIPAQUE IOHEXOL 350 MG/ML SOLN 75 mL of Omnipaque
350 IV.
FINDINGS: CTA NECK FINDINGS

Aortic arch: Great vessel origins are patent without significant
stenosis.

Right carotid system: Atherosclerosis at the carotid bifurcation
without greater than 50% stenosis relative to the distal vessel.

Left carotid system: Atherosclerosis at the carotid bifurcation with
approximately 50% stenosis of the ICA origin.

Vertebral arteries: Right dominant vertebral artery. Moderate
stenosis of the right vertebral artery origin due to
atherosclerosis. Otherwise, vertebral arteries are patent without
significant stenosis.

Skeleton: ACDF spanning C5-C7 with solid bony fusion.

Other neck: Approximately 1.7 x 1.0 cm right-sided tracheal
diverticulum at the level of the thyroid (series 6, image 75).

Upper chest: Biapical pleuroparenchymal scarring. Otherwise,
visualized lung apices are clear.

Review of the MIP images confirms the above findings

CTA HEAD FINDINGS

Anterior circulation: Calcific atherosclerosis of bilateral
intracranial ICAs with mild resulting narrowing. Bilateral MCAs and
ACAs are patent without proximal hemodynamically significant
stenosis. Approximately 2 mm inferiorly directed outpouching arising
from the proximal right cavernous ICA.

Posterior circulation: Bilateral intradural vertebral arteries,
basilar artery and both posterior cerebral arteries are patent.
Moderate bilateral P2 PCA stenosis.

Venous sinuses: As permitted by contrast timing, patent.

Anatomic variants: Detailed above.

Review of the MIP images confirms the above findings
IMPRESSION: 1. No emergent large vessel occlusion.
2. Moderate bilateral P2 PCA stenosis.
3. Bilateral carotid bifurcation atherosclerosis with approximately
50% stenosis of the left ICA origin.
4. Approximately 2 mm inferiorly directed outpouching arising from
the proximal right cavernous ICA, compatible with aneurysm versus
infundibulum with vessel not well seen.
5. Approximately 1.7 x 1.0 cm right-sided tracheal diverticulum.

## 2023-08-29 IMAGING — CT CT L SPINE W/O CM
3 series · 9 of 33 positions shown, 11 images · non-contrast
Comparison: MRI lumbar spine 07/02/2020

CLINICAL DATA: Lower back pain.  Increased fracture risk.



[Series 5: l spine 2.0 st · axial · 0.33mm/px · z∈[-602,-602]mm · 1 of 149 slices shown, 2 images]
[im 80/149  soft-tissue]
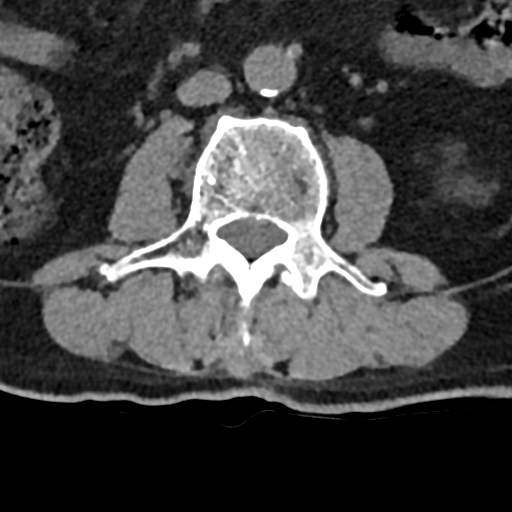
[im 80/149  bone]
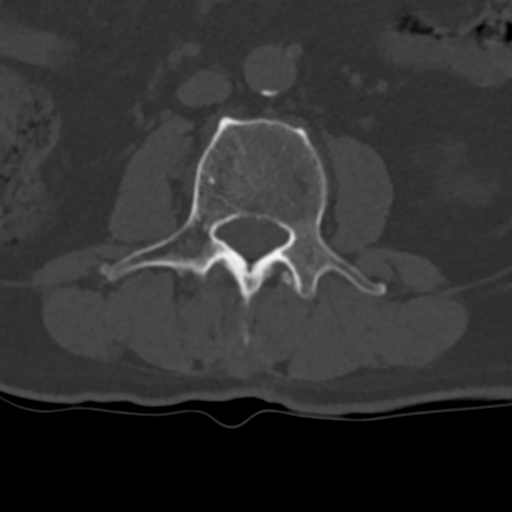

[Series 8: coronal st · coronal · 0.32mm/px · 3 of 76 slices shown]
[im 16/76  bone]
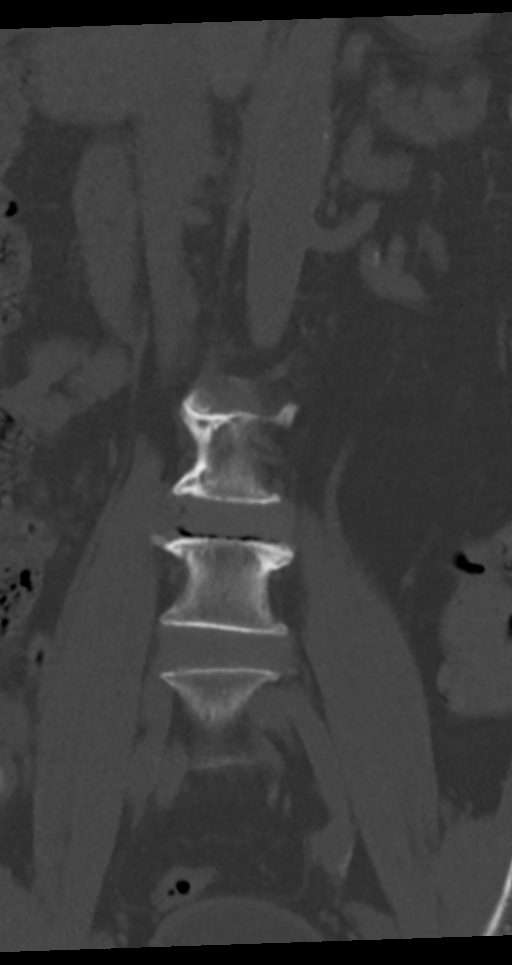
[im 31/76  bone]
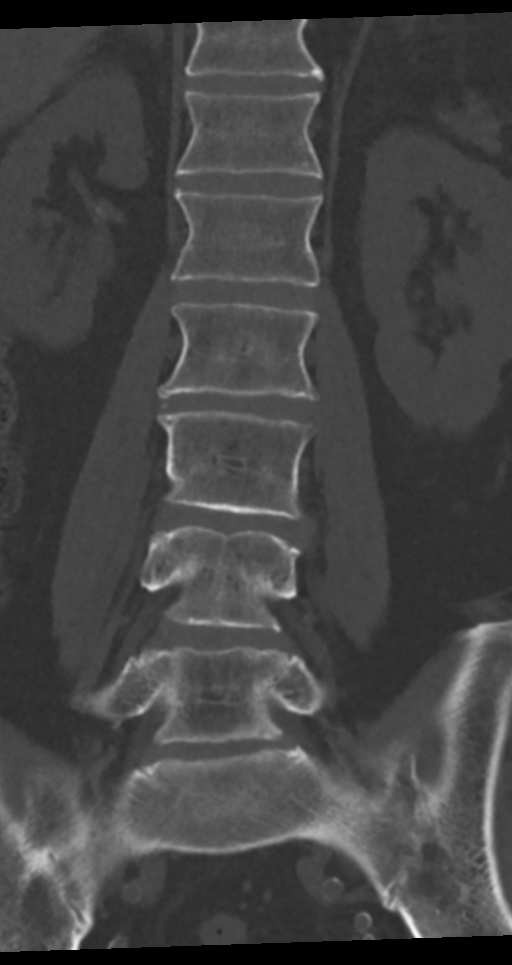
[im 46/76  bone]
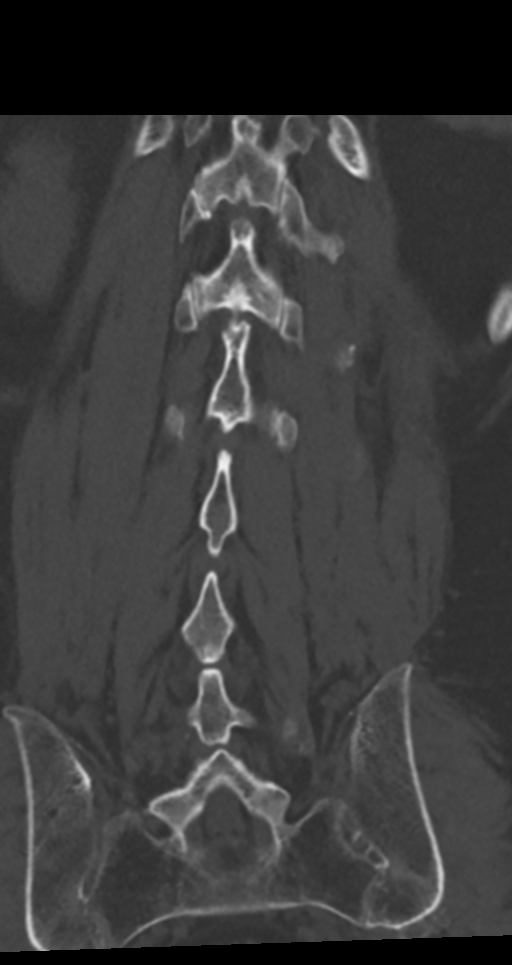

[Series 9: sagittal st · sagittal · 0.34mm/px · 5 of 79 slices shown, 6 images]
[im 27/79  bone]
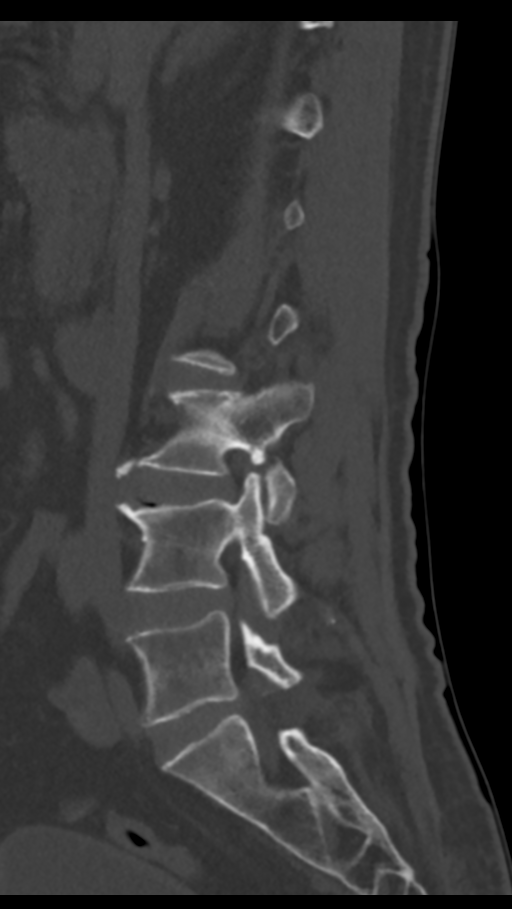
[im 33/79  bone]
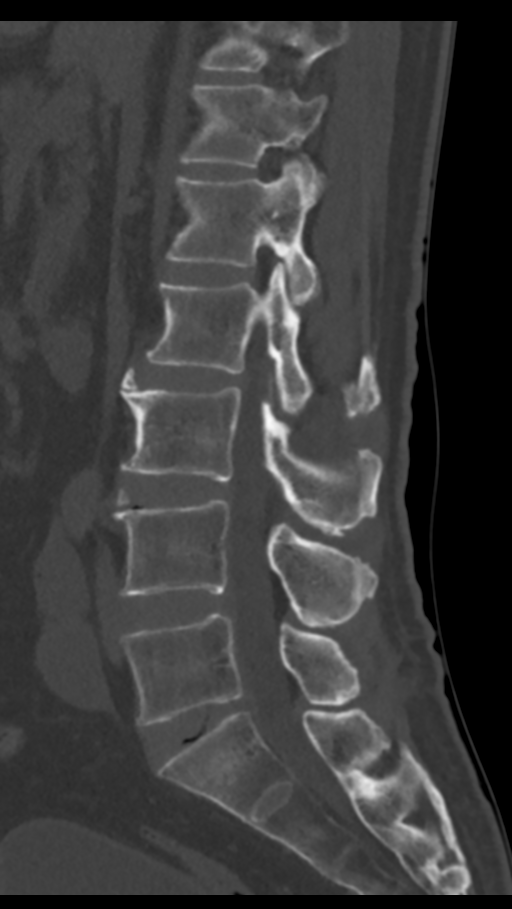
[im 40/79  soft-tissue]
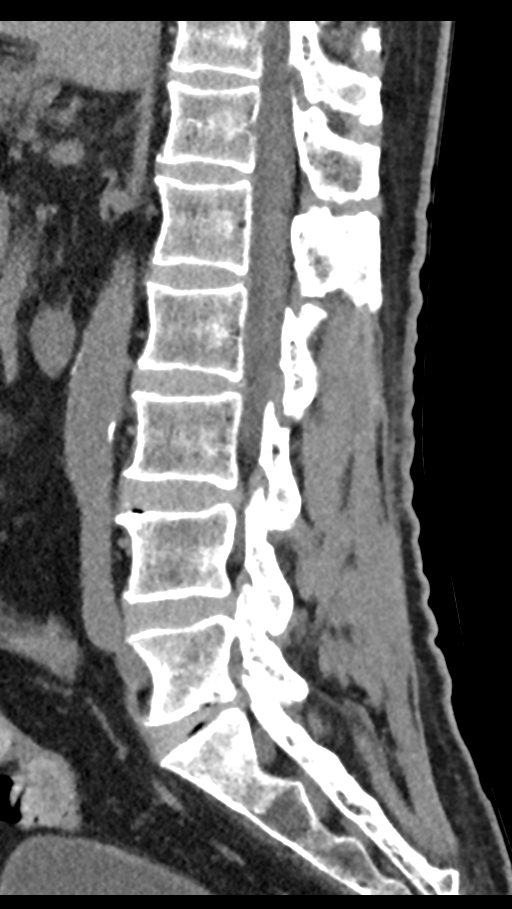
[im 40/79  bone]
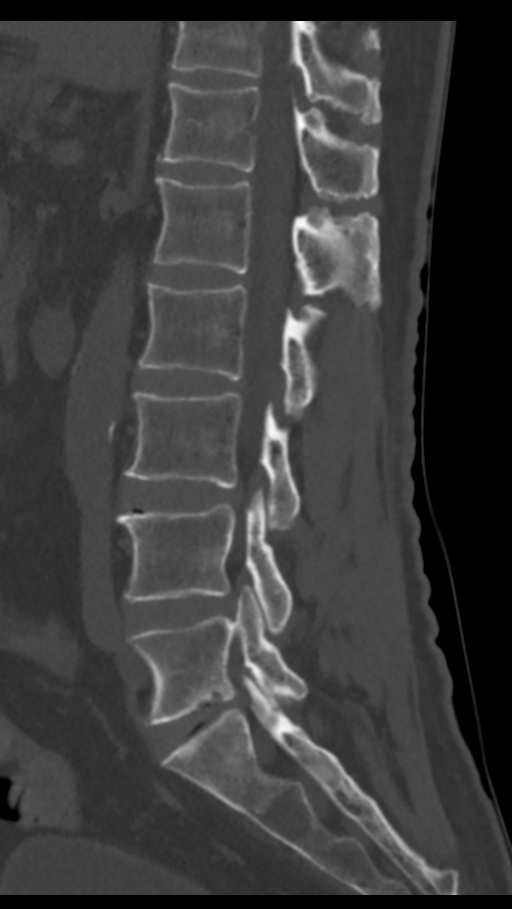
[im 46/79  bone]
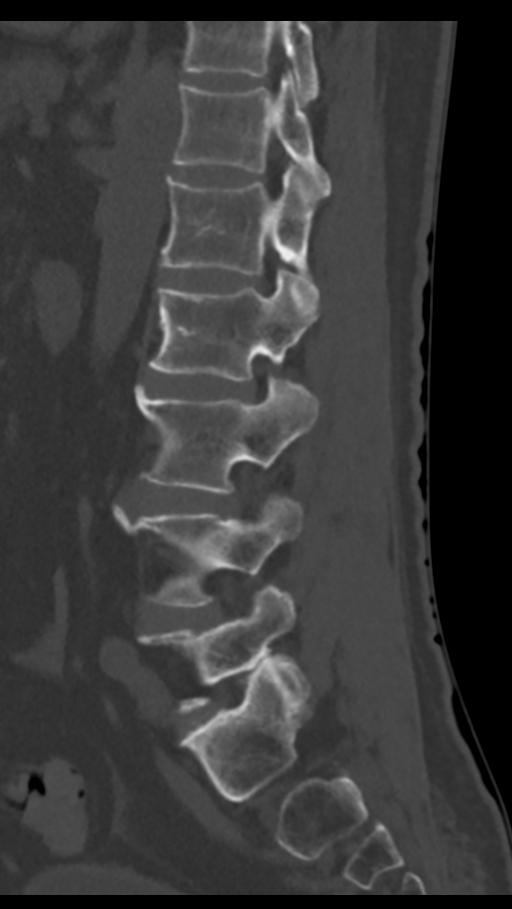
[im 53/79  bone]
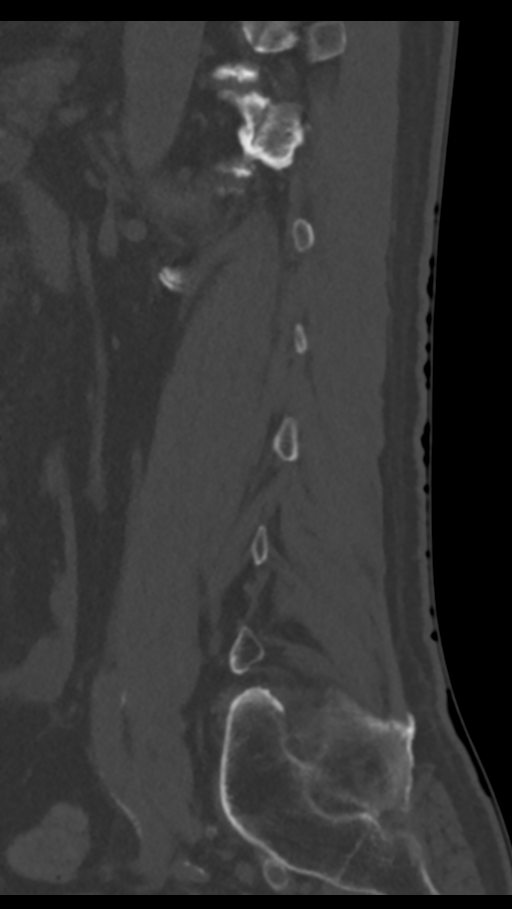

[9 of 33 positions shown; findings below may reference images not displayed]

FINDINGS: Segmentation: 5 non-rib-bearing lumbar-type vertebral bodies.

Alignment: No sagittal spondylolisthesis. No significant scoliotic
curvature.

Vertebrae: Vertebral body heights are maintained. Mild posterior
L5-S1 disc space narrowing. Mild-to-moderate anterior L2-3 and L3-4
endplate osteophytes. Mild L3-4 and L5-S1 degenerative vacuum disc.
Moderate bilateral sacroiliac joint space narrowing osteoarthritis.
No acute fracture.

Paraspinal and other soft tissues: Mild atherosclerotic
calcifications within the abdominal aorta.

Disc levels:

T11-12: Right-greater-than-left facet joint hypertrophy results in
mild right neuroforaminal stenosis. No central canal stenosis.

T12-L1: No central canal or neuroforaminal stenosis.

L1-2: Mild bilateral facet joint hypertrophy. No central canal or
neuroforaminal stenosis.

L2-3: Mild-to-moderate bilateral facet joint hypertrophy. Minimal
broad-based posterior disc osteophyte complex. Mild central canal
stenosis. Moderate right and mild left neuroforaminal stenosis.

L3-4: Mild-to-moderate bilateral facet joint hypertrophy. Mild
broad-based posterior disc osteophyte complex. Mild bilateral
neuroforaminal stenosis. Borderline mild central canal stenosis.

L4-5: Mild-to-moderate bilateral facet joint hypertrophy. Mild
broad-based posterior disc osteophyte complex. No significant
neuroforaminal stenosis. Mild left-greater-than-right lateral recess
narrowing and borderline mild central canal stenosis.

L5-S1: Mild-to-moderate bilateral facet joint hypertrophy. Mild
broad-based posterior disc osteophyte complex. Left intraforaminal
disc and endplate spurring and moderate left neuroforaminal
stenosis. No central canal stenosis.
IMPRESSION: 1. Multilevel degenerative disc and joint changes as above. No acute
fracture.
2. Mild L2-3 L3-4 and L4-5 central canal stenosis.
3. Multilevel neuroforaminal stenosis including moderate right and
mild left L2-3, mild bilateral L3-4, and moderate left L5-S1
neuroforaminal stenosis.
